# Patient Record
Sex: Female | Born: 1990 | Race: Black or African American | Hispanic: No | Marital: Single | Smoking: Never smoker
Health system: Southern US, Community
[De-identification: ages and names within clinical notes are randomized; demographics above are authoritative.]

## PROBLEM LIST (undated history)

## (undated) ENCOUNTER — Inpatient Hospital Stay (HOSPITAL_COMMUNITY): Payer: Self-pay

## (undated) DIAGNOSIS — B009 Herpesviral infection, unspecified: Secondary | ICD-10-CM

## (undated) HISTORY — PX: WISDOM TOOTH EXTRACTION: SHX21

---

## 2012-12-28 ENCOUNTER — Encounter (HOSPITAL_COMMUNITY): Payer: Self-pay | Admitting: Emergency Medicine

## 2012-12-28 ENCOUNTER — Emergency Department (HOSPITAL_COMMUNITY)
Admission: EM | Admit: 2012-12-28 | Discharge: 2012-12-28 | Disposition: A | Discharge status: Home / Self Care | Payer: Self-pay | Attending: Emergency Medicine | Admitting: Emergency Medicine

## 2012-12-28 DIAGNOSIS — B86 Scabies: Secondary | ICD-10-CM | POA: Insufficient documentation

## 2012-12-28 MED ORDER — PERMETHRIN 5 % EX CREA: TOPICAL_CREAM | CUTANEOUS | Status: DC

## 2012-12-28 NOTE — ED Notes (Signed)
Pt states she thinks she was exposed to scabbies a few days ago. Pt reports itching to both arms, both hands, and right thigh.

## 2012-12-28 NOTE — ED Provider Notes (Signed)
Medical screening examination/treatment/procedure(s) were performed by non-physician practitioner and as supervising physician I was immediately available for consultation/collaboration.   Dione Booze, MD 12/28/12 [phone]

## 2012-12-28 NOTE — ED Provider Notes (Signed)
History    This chart was scribed for non-physician practitioner Elpidio Anis, PA-C working with Dione Booze, MD by Gerlean Ren, ED Scribe. This patient was seen in room TR11C/TR11C and the patient's care was started at 10:01 PM.   CSN: [id number]  Arrival date & time 12/28/12  2127   First MD Initiated Contact with Patient 12/28/12 2150      Chief Complaint  Patient presents with  . Rash    The history is provided by the patient. No language interpreter was used.  Isabella Silva is a 22 y.o. 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     History reviewed. No pertinent past medical history.  History reviewed. No pertinent past surgical history.  No family history on file.  History  Substance Use Topics  . Smoking status: Never Smoker   . Smokeless tobacco: Not on file  . Alcohol Use: No    No OB history provided.   Review of Systems  Skin: Positive for rash.  All other systems reviewed and are negative.    Allergies  Review of patient's allergies indicates no known allergies.  Home Medications  No current outpatient prescriptions on file.  BP 154/90  Pulse 94  Temp(Src) 99.1 F (37.3 C) (Oral)  Resp 16  SpO2 100%  Physical Exam  Nursing note and vitals reviewed. Constitutional: She is oriented to person, place, and time. She appears well-developed and well-nourished. No distress.  HENT:  Head: Normocephalic and atraumatic.  Eyes: EOM are normal.  Neck: Neck supple. No tracheal deviation present.  Cardiovascular: Normal rate.   Pulmonary/Chest: Effort normal. No respiratory distress.  Musculoskeletal: Normal range of motion.  Neurological: She is alert and oriented to person, place, and time.  Skin: Skin is  warm and dry.  Psychiatric: She has a normal mood and affect. Her behavior is normal.    ED Course  Procedures (including critical care time) DIAGNOSTIC STUDIES: Oxygen Saturation is 100% on room air, normal by my interpretation.    COORDINATION OF CARE: 10:04 PM- Discussed treatment of scabies with lotion to be applied tonight.  Pt verbalizes understanding and agrees.    No diagnosis found.  1. Scabies   MDM  Exposure to scabies in Daycare setting with rash in common distribution of scabies infestation.      I personally performed the services described in this documentation, which was scribed in my presence. The recorded information has been reviewed and is accurate.     Arnoldo Hooker, PA-C 12/28/12 2217

## 2012-12-28 NOTE — ED Notes (Signed)
PT. REPORTS ITCHY RASHES AT ARMS /LEGS AND HANDS FOR SEVERAL DAYS .

## 2013-05-25 ENCOUNTER — Encounter (HOSPITAL_COMMUNITY): Payer: Self-pay

## 2013-05-25 ENCOUNTER — Emergency Department (HOSPITAL_COMMUNITY)
Admission: EM | Admit: 2013-05-25 | Discharge: 2013-05-25 | Disposition: A | Discharge status: Home / Self Care | Payer: Self-pay | Attending: Emergency Medicine | Admitting: Emergency Medicine

## 2013-05-25 DIAGNOSIS — L02419 Cutaneous abscess of limb, unspecified: Secondary | ICD-10-CM | POA: Insufficient documentation

## 2013-05-25 DIAGNOSIS — L0390 Cellulitis, unspecified: Secondary | ICD-10-CM

## 2013-05-25 MED ORDER — CEPHALEXIN 500 MG PO CAPS: 500.0000 mg | ORAL_CAPSULE | Freq: Four times a day (QID) | ORAL | Status: DC

## 2013-05-25 MED ORDER — SULFAMETHOXAZOLE-TRIMETHOPRIM 800-160 MG PO TABS: 1.0000 | ORAL_TABLET | Freq: Two times a day (BID) | ORAL | Status: DC

## 2013-05-25 NOTE — ED Provider Notes (Signed)
CSN: [id number]     Arrival date & time 05/25/13  1711 History  This chart was scribed for non-physician practitioner, Junious Silk, PA-C working with Shelda Jakes, MD by Greggory Stallion, ED scribe. This patient was seen in room TR10C/TR10C and the patient's care was started at 5:32 PM.   Chief Complaint  Patient presents with  . Abscess   The history is provided by the patient. No language interpreter was used.    HPI Comments: Courtnee Myer is a 22 y.o. female who presents to the Emergency Department complaining of a gradual onset abscess to her left lower leg with mild soreness that started 2 days ago. She states there has been some drainage and she tried to get it out but was unsuccessful. Palpation makes the pain worse. The area has gotten larger over the last few days. Pt denies fever.   No past medical history on file. No past surgical history on file. No family history on file. History  Substance Use Topics  . Smoking status: Never Smoker   . Smokeless tobacco: Not on file  . Alcohol Use: No   OB History   Grav Para Term Preterm Abortions TAB SAB Ect Mult Living                 Review of Systems  Constitutional: Negative for fever.  Skin:       Abscess  All other systems reviewed and are negative.    Allergies  Review of patient's allergies indicates no known allergies.  Home Medications   Current Outpatient Rx  Name  Route  Sig  Dispense  Refill  . medroxyPROGESTERone (DEPO-PROVERA) 150 MG/ML injection   Intramuscular   Inject 150 mg into the muscle every 3 (three) months.         . permethrin (ELIMITE) 5 % cream      Apply from neck down at night once then wash off in the morning. May repeat in one week with persistent symptoms.   60 g   0    BP 131/90  Pulse 45  Resp 18  SpO2 100%  Physical Exam  Nursing note and vitals reviewed. Constitutional: She is oriented to person, place, and time. She appears well-developed and well-nourished.  No distress.  HENT:  Head: Normocephalic and atraumatic.  Right Ear: External ear normal.  Left Ear: External ear normal.  Nose: Nose normal.  Mouth/Throat: Oropharynx is clear and moist.  Eyes: Conjunctivae are normal.  Neck: Normal range of motion.  Cardiovascular: Normal rate, regular rhythm and normal heart sounds.   Pulmonary/Chest: Effort normal and breath sounds normal. No stridor. No respiratory distress. She has no wheezes. She has no rales.  Abdominal: Soft. She exhibits no distension.  Musculoskeletal: Normal range of motion.  Neurological: She is alert and oriented to person, place, and time. She has normal strength.  Skin: Skin is warm and dry. She is not diaphoretic. No erythema.  6 cm area of erythema with no induration to left lower leg.   Psychiatric: She has a normal mood and affect. Her behavior is normal.    ED Course  Procedures (including critical care time)  DIAGNOSTIC STUDIES: Oxygen Saturation is 100% on RA, normal by my interpretation.    COORDINATION OF CARE: 5:36 PM-Discussed treatment plan which includes ultrasound to see if there is anything to I&D and antibiotic with pt at bedside and pt agreed to plan.   No fluid collection seen on ultrasound.   Labs Review Labs  Reviewed - No data to display Imaging Review No results found.  MDM   1. Cellulitis    Suspect uncomplicated cellulitis based on limited area of involvement, minimal pain, no systemic signs of illness (eg, fever, chills, dehydration, altered mental status, tachypnea, tachycardia, hypotension), no risk factors for serious illness (eg, extremes of age, general debility, immunocompromised status).   PE reveals redness, swelling, mildly tender, warm to touch. Skin intact, No bleeding. No bullae. Non purulent. Non circumferential.  Borders are not elevated or sharply demarcated.  Preformed ultrasound and did not detect any occult abscess. Drew a line around the area of infection. Pt was  instructed to return to the ED if area surpasses the boarder or pain intensifies.   Prescribed Bactrim to cover for MRSA and Keflex for other skin flora, direct pt to apply warm compresses and to return to ED for I&D if pain should increase or abscess should develop.          I personally performed the services described in this documentation, which was scribed in my presence. The recorded information has been reviewed and is accurate.    Mora Bellman, PA-C 05/25/13 1755

## 2013-05-25 NOTE — ED Notes (Signed)
Pt reports an abscess to her Left lower leg x6 days, pt reports pain, redness, swelling, and purulent drainage from area.

## 2013-05-25 NOTE — ED Notes (Signed)
Pt. Stated, It came up last Thursday and it was a little bump and its just gotten bigger and painful. No itching.  Pt. Unaware of any type of bite.

## 2013-05-26 NOTE — ED Provider Notes (Signed)
Medical screening examination/treatment/procedure(s) were performed by non-physician practitioner and as supervising physician I was immediately available for consultation/collaboration.   Shelda Jakes, MD 05/26/13 [phone]

## 2013-09-20 ENCOUNTER — Emergency Department (HOSPITAL_COMMUNITY)
Admission: EM | Admit: 2013-09-20 | Discharge: 2013-09-20 | Disposition: A | Discharge status: Home / Self Care | Payer: Medicaid Other | Attending: Emergency Medicine | Admitting: Emergency Medicine

## 2013-09-20 ENCOUNTER — Encounter (HOSPITAL_COMMUNITY): Payer: Self-pay | Admitting: Emergency Medicine

## 2013-09-20 DIAGNOSIS — M549 Dorsalgia, unspecified: Secondary | ICD-10-CM | POA: Insufficient documentation

## 2013-09-20 DIAGNOSIS — G44209 Tension-type headache, unspecified, not intractable: Secondary | ICD-10-CM

## 2013-09-20 DIAGNOSIS — Z3202 Encounter for pregnancy test, result negative: Secondary | ICD-10-CM | POA: Insufficient documentation

## 2013-09-20 DIAGNOSIS — Z791 Long term (current) use of non-steroidal anti-inflammatories (NSAID): Secondary | ICD-10-CM | POA: Insufficient documentation

## 2013-09-20 DIAGNOSIS — M542 Cervicalgia: Secondary | ICD-10-CM | POA: Insufficient documentation

## 2013-09-20 DIAGNOSIS — R51 Headache: Secondary | ICD-10-CM | POA: Insufficient documentation

## 2013-09-20 LAB — URINALYSIS, ROUTINE W REFLEX MICROSCOPIC
BILIRUBIN URINE: NEGATIVE
GLUCOSE, UA: NEGATIVE mg/dL
HGB URINE DIPSTICK: NEGATIVE
KETONES UR: NEGATIVE mg/dL
Leukocytes, UA: NEGATIVE
NITRITE: NEGATIVE
PH: 6 (ref 5.0–8.0)
Protein, ur: NEGATIVE mg/dL
SPECIFIC GRAVITY, URINE: 1.019 (ref 1.005–1.030)
Urobilinogen, UA: 0.2 mg/dL (ref 0.0–1.0)

## 2013-09-20 LAB — POCT PREGNANCY, URINE: Preg Test, Ur: NEGATIVE

## 2013-09-20 MED ORDER — METOCLOPRAMIDE HCL 5 MG PO TABS
5.0000 mg | ORAL_TABLET | Freq: Once | ORAL | Status: AC
  Administered 2013-09-20: 5 mg via ORAL
  Filled 2013-09-20: qty 1

## 2013-09-20 MED ORDER — DIPHENHYDRAMINE HCL 25 MG PO CAPS
25.0000 mg | ORAL_CAPSULE | Freq: Once | ORAL | Status: AC
  Administered 2013-09-20: 25 mg via ORAL
  Filled 2013-09-20: qty 1

## 2013-09-20 MED ORDER — KETOROLAC TROMETHAMINE 30 MG/ML IJ SOLN
30.0000 mg | Freq: Once | INTRAMUSCULAR | Status: AC
  Administered 2013-09-20: 30 mg via INTRAMUSCULAR

## 2013-09-20 MED ORDER — KETOROLAC TROMETHAMINE 30 MG/ML IJ SOLN
30.0000 mg | Freq: Once | INTRAMUSCULAR | Status: DC
  Filled 2013-09-20: qty 1

## 2013-09-20 MED ORDER — CYCLOBENZAPRINE HCL 10 MG PO TABS: 10.0000 mg | ORAL_TABLET | Freq: Two times a day (BID) | ORAL | Status: DC | PRN

## 2013-09-20 MED ORDER — NAPROXEN 500 MG PO TABS: 500.0000 mg | ORAL_TABLET | Freq: Two times a day (BID) | ORAL | Status: DC

## 2013-09-20 NOTE — ED Provider Notes (Signed)
CSN: [id number]     Arrival date & time 09/20/13  0234 History   First MD Initiated Contact with Patient 09/20/13 0246     Chief Complaint  Patient presents with  . Headache  . Back Pain   (Consider location/radiation/quality/duration/timing/severity/associated sxs/prior Treatment) Patient is a 23 y.o. female presenting with headaches and back pain.  Headache Pain location:  Generalized Radiates to:  Upper back Severity currently:  3/10 Onset quality:  Gradual Duration:  1 day Timing:  Constant Progression:  Unchanged Chronicity:  New Relieved by:  Nothing Worsened by:  Activity Ineffective treatments:  NSAIDs Associated symptoms: back pain and neck pain   Associated symptoms: no cough, no dizziness, no fever, no focal weakness, no hearing loss, no nausea, no photophobia, no sinus pressure and no sore throat   Risk factors: no family hx of SAH and lifestyle not sedentary   Back Pain Associated symptoms: headaches   Associated symptoms: no dysuria, no fever and no weakness     History reviewed. No pertinent past medical history. History reviewed. No pertinent past surgical history. No family history on file. History  Substance Use Topics  . Smoking status: Never Smoker   . Smokeless tobacco: Not on file  . Alcohol Use: No   OB History   Grav Para Term Preterm Abortions TAB SAB Ect Mult Living                 Review of Systems  Unable to perform ROS Constitutional: Negative for fever.  HENT: Negative for hearing loss, sinus pressure and sore throat.   Eyes: Negative for photophobia.  Respiratory: Negative for cough.   Gastrointestinal: Negative for nausea.  Genitourinary: Negative for dysuria, vaginal bleeding and vaginal discharge.  Musculoskeletal: Positive for back pain and neck pain.  Skin: Negative for rash.  Neurological: Positive for headaches. Negative for dizziness, focal weakness and weakness.  All other systems reviewed and are  negative.    Allergies  Review of patient's allergies indicates no known allergies.  Home Medications   Current Outpatient Rx  Name  Route  Sig  Dispense  Refill  . ibuprofen (ADVIL,MOTRIN) 200 MG tablet   Oral   Take 400 mg by mouth every 6 (six) hours as needed for pain.         . cyclobenzaprine (FLEXERIL) 10 MG tablet   Oral   Take 1 tablet (10 mg total) by mouth 2 (two) times daily as needed for muscle spasms.   10 tablet   0   . naproxen (NAPROSYN) 500 MG tablet   Oral   Take 1 tablet (500 mg total) by mouth 2 (two) times daily.   20 tablet   0    BP 117/70  Pulse 87  Temp(Src) 98.8 F (37.1 C) (Oral)  Resp 16  Ht 5\' 6"  (1.676 m)  Wt 172 lb 5 oz (78.16 kg)  BMI 27.83 kg/m2  SpO2 100%  LMP 09/05/2013 Physical Exam  Nursing note and vitals reviewed. Constitutional: She appears well-developed and well-nourished.  HENT:  Head: Normocephalic.  Right Ear: External ear normal.  Left Ear: External ear normal.  Eyes: Pupils are equal, round, and reactive to light.  Neck: Normal range of motion. Muscular tenderness present. No spinous process tenderness present. No erythema present.  Cardiovascular: Normal rate and regular rhythm.   Pulmonary/Chest: Effort normal.  Abdominal: Soft.  Musculoskeletal: She exhibits edema. She exhibits no tenderness.       Back:  Lymphadenopathy:    She  has no cervical adenopathy.  Neurological: She is alert.  Skin: Skin is warm. No rash noted. No erythema.    ED Course  Procedures (including critical care time) Labs Review Labs Reviewed  URINALYSIS, ROUTINE W REFLEX MICROSCOPIC  POCT PREGNANCY, URINE   Imaging Review No results found.  EKG Interpretation   None       MDM   1. Headache, tension-type   2. Back pain         Arman Filter, NP 09/20/13 [phone]

## 2013-09-20 NOTE — Discharge Instructions (Signed)
If you were given medicines take as directed.  If you are on coumadin or contraceptives realize their levels and effectiveness is altered by many different medicines.  If you have any reaction (rash, tongues swelling, other) to the medicines stop taking and see a physician.   °Please follow up as directed and return to the ER or see a physician for new or worsening symptoms.  Thank you. ° ° °

## 2013-09-20 NOTE — ED Provider Notes (Signed)
Medical screening examination/treatment/procedure(s) were conducted as a shared visit with non-physician practitioner(s) or resident and myself. I personally evaluated the patient during the encounter and agree with the findings and plan unless otherwise indicated.  Labs Reviewed  URINALYSIS, ROUTINE W REFLEX MICROSCOPIC  POCT PREGNANCY, URINE    I have personally reviewed any xrays and/ or EKG's with the provider and I agree with interpretation.  Pt with HA, general radiating to neck. No injuries, fevers, IVDU. Gradual onset. No neck stiffness.  Exam 5+ strength in UE and LE with f/e at major joints.  Sensation to palpation intact in UE and LE.  CNs 2-12 grossly intact. EOMFI. PERRL.  Finger nose and coordination intact bilateral.  Visual fields intact to finger testing.  Supple neck, no meningismus.  Supportive care/ pain meds in ED. Pt improved on recheck. Likely tension HA. I do not suspect SAH, Thrombosis, Stroke or meningitis at this time. No new furnace or family members with HA.  Fup discussed.  HA tension like   Adlai Nieblas M ZEnid Skeensavitz, MD 09/20/13 [id number]

## 2013-09-20 NOTE — ED Notes (Signed)
PT reports pain to back from upper back down to lower back . Unknown cause. Pt also reports a HA.

## 2013-11-06 ENCOUNTER — Inpatient Hospital Stay (HOSPITAL_COMMUNITY): Payer: Medicaid Other

## 2013-11-06 ENCOUNTER — Encounter (HOSPITAL_COMMUNITY): Payer: Self-pay | Admitting: Family

## 2013-11-06 ENCOUNTER — Inpatient Hospital Stay (HOSPITAL_COMMUNITY)
Admission: AD | Admit: 2013-11-06 | Discharge: 2013-11-06 | Disposition: A | Discharge status: Home / Self Care | Payer: Medicaid Other | Source: Ambulatory Visit | Attending: Obstetrics & Gynecology | Admitting: Obstetrics & Gynecology

## 2013-11-06 DIAGNOSIS — O26849 Uterine size-date discrepancy, unspecified trimester: Secondary | ICD-10-CM

## 2013-11-06 DIAGNOSIS — R109 Unspecified abdominal pain: Secondary | ICD-10-CM | POA: Insufficient documentation

## 2013-11-06 DIAGNOSIS — M25519 Pain in unspecified shoulder: Secondary | ICD-10-CM | POA: Insufficient documentation

## 2013-11-06 DIAGNOSIS — O99891 Other specified diseases and conditions complicating pregnancy: Secondary | ICD-10-CM | POA: Insufficient documentation

## 2013-11-06 DIAGNOSIS — W108XXA Fall (on) (from) other stairs and steps, initial encounter: Secondary | ICD-10-CM | POA: Insufficient documentation

## 2013-11-06 DIAGNOSIS — O9989 Other specified diseases and conditions complicating pregnancy, childbirth and the puerperium: Principal | ICD-10-CM

## 2013-11-06 DIAGNOSIS — O26899 Other specified pregnancy related conditions, unspecified trimester: Secondary | ICD-10-CM

## 2013-11-06 LAB — URINALYSIS, ROUTINE W REFLEX MICROSCOPIC
Bilirubin Urine: NEGATIVE
GLUCOSE, UA: NEGATIVE mg/dL
HGB URINE DIPSTICK: NEGATIVE
Ketones, ur: NEGATIVE mg/dL
LEUKOCYTES UA: NEGATIVE
Nitrite: NEGATIVE
PROTEIN: NEGATIVE mg/dL
SPECIFIC GRAVITY, URINE: 1.01 (ref 1.005–1.030)
UROBILINOGEN UA: 0.2 mg/dL (ref 0.0–1.0)
pH: 6.5 (ref 5.0–8.0)

## 2013-11-06 LAB — HCG, QUANTITATIVE, PREGNANCY: HCG, BETA CHAIN, QUANT, S: 115410 m[IU]/mL — AB (ref <5)

## 2013-11-06 LAB — WET PREP, GENITAL
Clue Cells Wet Prep HPF POC: NONE SEEN
Trich, Wet Prep: NONE SEEN
Yeast Wet Prep HPF POC: NONE SEEN

## 2013-11-06 LAB — CBC
HCT: 36.9 % (ref 36.0–46.0)
Hemoglobin: 13 g/dL (ref 12.0–15.0)
MCH: 27.1 pg (ref 26.0–34.0)
MCHC: 35.2 g/dL (ref 30.0–36.0)
MCV: 77 fL — AB (ref 78.0–100.0)
PLATELETS: 251 10*3/uL (ref 150–400)
RBC: 4.79 MIL/uL (ref 3.87–5.11)
RDW: 14.8 % (ref 11.5–15.5)
WBC: 5.7 10*3/uL (ref 4.0–10.5)

## 2013-11-06 LAB — POCT PREGNANCY, URINE: PREG TEST UR: POSITIVE — AB

## 2013-11-06 LAB — ABO/RH: ABO/RH(D): O POS

## 2013-11-06 NOTE — MAU Provider Note (Signed)
History   Isabella Silva is a 23 yo G1 at [redacted]wks gestation based on LMP (08/03/14), presents to MAU s/p fall yesterday. Reports she lost balance and fell down about 4-5 indoor steps and landed on her R side. She denies any dizziness prior to fall and has no history of seizures.  The patient reports pain in the R shoulder but not decrease in ROM.  She has not taken in medication for the pain. Patient also reports  lower abdominal pain.  Denies VB, LOF. She had a negative pregnancy test in MAU on 1/20, but [facility]   CSN: [id number]  Arrival date and time: 11/06/13 1017   First Provider Initiated Contact with Patient 11/06/13 1046      Chief Complaint  Patient presents with  . Fall   HPI   Past Medical History  Diagnosis Date  . Medical history non-contributory     Past Surgical History  Procedure Laterality Date  . No past surgeries      History reviewed. No pertinent family history.  History  Substance Use Topics  . Smoking status: Never Smoker   . Smokeless tobacco: Not on file  . Alcohol Use: No    Allergies: No Known Allergies  Prescriptions prior to admission  Medication Sig Dispense Refill  . Prenatal Vit-Fe Fumarate-FA (PRENATAL MULTIVITAMIN) TABS tablet Take 1 tablet by mouth daily at 12 noon.        Review of Systems  Constitutional: Negative.   HENT: Negative.   Eyes: Negative.   Respiratory: Negative.   Cardiovascular: Negative.   Gastrointestinal: Positive for abdominal pain.  Genitourinary: Positive for frequency.  Musculoskeletal: Positive for falls and joint pain.  Skin: Negative.   Neurological: Negative.   Endo/Heme/Allergies: Negative.   Psychiatric/Behavioral: Negative.    Physical Exam   Blood pressure 132/76, pulse 93, temperature 98.3 F (36.8 C), temperature source Oral, resp. rate 13, height 5\' 5"  (1.651 m), weight 77.111 kg (170 lb), last menstrual period 08/03/2013.  Physical Exam  Constitutional:  She is oriented to person, place, and time. She appears well-developed and well-nourished.  HENT:  Head: Normocephalic and atraumatic.  Eyes: Conjunctivae are normal.  Neck: Normal range of motion. Neck supple.  Cardiovascular: Normal rate, regular rhythm and normal heart sounds.   Respiratory: Effort normal and breath sounds normal.  GI: Soft. There is tenderness.  Lower abdomen tender to palpation.  Genitourinary: Vagina normal. No vaginal discharge found.  Gravid uterus   Musculoskeletal: She exhibits tenderness.  Pain in right shoulder.   Neurological: She is alert and oriented to person, place, and time.  Skin: Skin is warm and dry.  Psychiatric: She has a normal mood and affect. Her behavior is normal. Thought content normal.    FHT: not heard by nurse or midwifery student  MAU Course  Procedures  MDM  Results for orders placed during the hospital encounter of 11/06/13 (from the past 24 hour(s))  URINALYSIS, ROUTINE W REFLEX MICROSCOPIC     Status: None   Collection Time    11/06/13 10:24 AM      Result Value Ref Range   Color, Urine YELLOW  YELLOW   APPearance CLEAR  CLEAR   Specific Gravity, Urine 1.010  1.005 - 1.030   pH 6.5  5.0 - 8.0   Glucose, UA NEGATIVE  NEGATIVE mg/dL   Hgb urine dipstick NEGATIVE  NEGATIVE   Bilirubin Urine NEGATIVE  NEGATIVE   Ketones, ur NEGATIVE  NEGATIVE mg/dL   Protein, ur NEGATIVE  NEGATIVE mg/dL   Urobilinogen, UA 0.2  0.0 - 1.0 mg/dL   Nitrite NEGATIVE  NEGATIVE   Leukocytes, UA NEGATIVE  NEGATIVE  POCT PREGNANCY, URINE     Status: Abnormal   Collection Time    11/06/13 11:01 AM      Result Value Ref Range   Preg Test, Ur POSITIVE (*) NEGATIVE  WET PREP, GENITAL     Status: Abnormal   Collection Time    11/06/13 11:20 AM      Result Value Ref Range   Yeast Wet Prep HPF POC NONE SEEN  NONE SEEN   Trich, Wet Prep NONE SEEN  NONE SEEN   Clue Cells Wet Prep HPF POC NONE SEEN  NONE SEEN   WBC, Wet Prep HPF POC FEW (*) NONE  SEEN  HCG, QUANTITATIVE, PREGNANCY     Status: Abnormal   Collection Time    11/06/13 11:40 AM      Result Value Ref Range   hCG, Beta Nyra Jabs, [place] 161096 (*) <5 mIU/mL  ABO/RH     Status: None   Collection Time    11/06/13 11:40 AM      Result Value Ref Range   ABO/RH(D) O POS    CBC     Status: Abnormal   Collection Time    11/06/13 11:40 AM      Result Value Ref Range   WBC 5.7  4.0 - 10.5 K/uL   RBC 4.79  3.87 - 5.11 MIL/uL   Hemoglobin 13.0  12.0 - 15.0 g/dL   HCT 04.5  40.9 - 81.1 %   MCV 77.0 (*) 78.0 - 100.0 fL   MCH 27.1  26.0 - 34.0 pg   MCHC 35.2  30.0 - 36.0 g/dL   RDW 91.4  78.2 - 95.6 %   Platelets 251  150 - 400 K/uL     Assessment and Plan  23 yo G1P0 with SIUP at 10.3 weeks Muscle aches Normal ultrasound  Discharge to home Advise to schedule initial PNV with [facility] or other provider Given confirmation of pregnancy letter  Selena Lesser 11/06/2013, 11:02 AM   I was present for the exam and agree with above.  US Ob Comp Less 14 Wks  11/06/2013   CLINICAL DATA:  Thirteen weeks bile as menstrual period. Lower abdominal pain.  EXAM: OBSTETRIC <14 WK ULTRASOUND  TECHNIQUE: Transabdominal ultrasound was performed for evaluation of the gestation as well as the maternal uterus and adnexal regions.  COMPARISON:  None.  FINDINGS: Intrauterine gestational sac: Visualized/normal in shape.  Yolk sac:  Visualized  Embryo:  Visualized  Cardiac Activity: Visualized  Heart Rate: 179 bpm  MSD:   mm    w     d  CRL:   34  mm   10 w 3 d                  [place] EDC: 06/01/2014  Maternal uterus/adnexae: No subchorionic hemorrhage. No adnexal masses. No free fluid.  IMPRESSION: 10 week 3 day intrauterine pregnancy by crown-rump length. Fetal heart rate 179 beats per min. No acute maternal findings.   Electronically Signed   By: Charlett Nose M.D.   On: 11/06/2013 12:23    Dorathy Kinsman, [place] 11/06/2013 3:50 PM

## 2013-11-06 NOTE — Discharge Instructions (Signed)
Abdominal Pain During Pregnancy °Abdominal pain is common in pregnancy. Most of the time, it does not cause harm. There are many causes of abdominal pain. Some causes are more serious than others. Some of the causes of abdominal pain in pregnancy are easily diagnosed. Occasionally, the diagnosis takes time to understand. Other times, the cause is not determined. Abdominal pain can be a sign that something is very wrong with the pregnancy, or the pain may have nothing to do with the pregnancy at all. For this reason, always tell your health care provider if you have any abdominal discomfort. °HOME CARE INSTRUCTIONS  °Monitor your abdominal pain for any changes. The following actions may help to alleviate any discomfort you are experiencing: °· Do not have sexual intercourse or put anything in your vagina until your symptoms go away completely. °· Get plenty of rest until your pain improves. °· Drink clear fluids if you feel nauseous. Avoid solid food as long as you are uncomfortable or nauseous. °· Only take over-the-counter or prescription medicine as directed by your health care provider. °· Keep all follow-up appointments with your health care provider. °SEEK IMMEDIATE MEDICAL CARE IF: °· You are bleeding, leaking fluid, or passing tissue from the vagina. °· You have increasing pain or cramping. °· You have persistent vomiting. °· You have painful or bloody urination. °· You have a fever. °· You notice a decrease in your baby's movements. °· You have extreme weakness or feel faint. °· You have shortness of breath, with or without abdominal pain. °· You develop a severe headache with abdominal pain. °· You have abnormal vaginal discharge with abdominal pain. °· You have persistent diarrhea. °· You have abdominal pain that continues even after rest, or gets worse. °MAKE SURE YOU:  °· Understand these instructions. °· Will watch your condition. °· Will get help right away if you are not doing well or get  worse. °Document Released: 08/18/2005 Document Revised: 06/08/2013 Document Reviewed: 03/17/2013 °[employer]® Patient Information ©2014 [employer]. ° °

## 2013-11-06 NOTE — MAU Note (Addendum)
22, LMP 08/03/14, presents to MAU s/p fall yesterday. Reports she lost balance and fell down about 4-5 indoor steps and landed on her R side, fell onto R shoulder. Denies cardiac or neural prodrome; has never had seizure.  She is having L abdominal discomfort and shoulder pain discomfort. Denies decreased shoulder ROM. Denies VB, LOF. Has not taken any meds.  Reports she is approximately 12 weeks, has been seen at [facility] for pregnancy confirmation; awaiting pg medicaid.

## 2013-11-07 LAB — GC/CHLAMYDIA PROBE AMP
CT PROBE, AMP APTIMA: NEGATIVE
GC Probe RNA: NEGATIVE

## 2013-11-08 LAB — OB RESULTS CONSOLE ABO/RH: RH Type: POSITIVE

## 2013-11-08 LAB — OB RESULTS CONSOLE GC/CHLAMYDIA
Chlamydia: NEGATIVE
GC PROBE AMP, GENITAL: NEGATIVE

## 2013-11-30 LAB — OB RESULTS CONSOLE RPR: RPR: NONREACTIVE

## 2013-11-30 LAB — OB RESULTS CONSOLE HIV ANTIBODY (ROUTINE TESTING): HIV: NONREACTIVE

## 2013-11-30 LAB — OB RESULTS CONSOLE HEPATITIS B SURFACE ANTIGEN: Hepatitis B Surface Ag: NEGATIVE

## 2013-11-30 LAB — OB RESULTS CONSOLE ANTIBODY SCREEN: ANTIBODY SCREEN: NEGATIVE

## 2013-11-30 LAB — OB RESULTS CONSOLE RUBELLA ANTIBODY, IGM: Rubella: IMMUNE

## 2014-03-18 ENCOUNTER — Encounter (HOSPITAL_COMMUNITY): Payer: Self-pay

## 2014-03-18 ENCOUNTER — Inpatient Hospital Stay (HOSPITAL_COMMUNITY)
Admission: AD | Admit: 2014-03-18 | Discharge: 2014-03-18 | Disposition: A | Discharge status: Home / Self Care | Payer: Medicaid Other | Source: Ambulatory Visit | Attending: Obstetrics & Gynecology | Admitting: Obstetrics & Gynecology

## 2014-03-18 DIAGNOSIS — M62838 Other muscle spasm: Secondary | ICD-10-CM | POA: Diagnosis not present

## 2014-03-18 DIAGNOSIS — R42 Dizziness and giddiness: Secondary | ICD-10-CM

## 2014-03-18 DIAGNOSIS — R109 Unspecified abdominal pain: Secondary | ICD-10-CM | POA: Insufficient documentation

## 2014-03-18 DIAGNOSIS — O99891 Other specified diseases and conditions complicating pregnancy: Secondary | ICD-10-CM | POA: Diagnosis not present

## 2014-03-18 DIAGNOSIS — M545 Low back pain, unspecified: Secondary | ICD-10-CM | POA: Diagnosis not present

## 2014-03-18 DIAGNOSIS — O9989 Other specified diseases and conditions complicating pregnancy, childbirth and the puerperium: Principal | ICD-10-CM

## 2014-03-18 DIAGNOSIS — G44209 Tension-type headache, unspecified, not intractable: Secondary | ICD-10-CM

## 2014-03-18 LAB — URINALYSIS W MICROSCOPIC (NOT AT ARMC)
Bilirubin Urine: NEGATIVE
GLUCOSE, UA: NEGATIVE mg/dL
HGB URINE DIPSTICK: NEGATIVE
Ketones, ur: NEGATIVE mg/dL
Nitrite: NEGATIVE
PH: 8 (ref 5.0–8.0)
Protein, ur: NEGATIVE mg/dL
Specific Gravity, Urine: 1.01 (ref 1.005–1.030)
Urobilinogen, UA: 0.2 mg/dL (ref 0.0–1.0)

## 2014-03-18 MED ORDER — CYCLOBENZAPRINE HCL 5 MG PO TABS: 5.0000 mg | ORAL_TABLET | Freq: Three times a day (TID) | ORAL | Status: DC | PRN

## 2014-03-18 NOTE — Discharge Instructions (Signed)
General Headache Without Cause  A general headache is pain or discomfort felt around the head or neck area. The cause may not be found.   HOME CARE   · Keep all doctor visits.  · Only take medicines as told by your doctor.  · Lie down in a dark, quiet room when you have a headache.  · Keep a journal to find out if certain things bring on headaches. For example, write down:  ¨ What you eat and drink.  ¨ How much sleep you get.  ¨ Any change to your diet or medicines.  · Relax by getting a massage or doing other relaxing activities.  · Put ice or heat packs on the head and neck area as told by your doctor.  · Lessen stress.  · Sit up straight. Do not tighten (tense) your muscles.  · Quit smoking if you smoke.  · Lessen how much alcohol you drink.  · Lessen how much caffeine you drink, or stop drinking caffeine.  · Eat and sleep on a regular schedule.  · Get 7 to 9 hours of sleep, or as told by your doctor.  · Keep lights dim if bright lights bother you or make your headaches worse.  GET HELP RIGHT AWAY IF:   · Your headache becomes really bad.  · You have a fever.  · You have a stiff neck.  · You have trouble seeing.  · Your muscles are weak, or you lose muscle control.  · You lose your balance or have trouble walking.  · You feel like you will pass out (faint), or you pass out.  · You have really bad symptoms that are different than your first symptoms.  · You have problems with the medicines given to you by your doctor.  · Your medicines do not work.  · Your headache feels different than the other headaches.  · You feel sick to your stomach (nauseous) or throw up (vomit).  MAKE SURE YOU:   · Understand these instructions.  · Will watch your condition.  · Will get help right away if you are not doing well or get worse.  Document Released: 05/27/2008 Document Revised: 11/10/2011 Document Reviewed: 08/08/2011  ExitCare® Patient Information ©2015 [employer]. This information is not intended to replace advice given to  you by your health care provider. Make sure you discuss any questions you have with your health care provider.

## 2014-03-18 NOTE — MAU Provider Note (Signed)
Attestation of Attending Supervision of Fellow: Evaluation and management procedures were performed by the Fellow under my supervision and collaboration.  I have reviewed the Fellow's note and chart, and I agree with the management and plan.    

## 2014-03-18 NOTE — Progress Notes (Signed)
Notified of pt arrival in MAU and current complaints. Will come see pt

## 2014-03-18 NOTE — MAU Provider Note (Signed)
°  History     CSN: [id number]  Arrival date and time: 03/18/14 1304   None     Chief Complaint  Patient presents with   Abdominal Pain   Back Pain   Headache   HPI   Headaches: intermittently throughout week.  Resolve with tylenol, occur up to 3x weekly, worse with stress. No associated photophobia, light sensitivity, nausea/vomiting.  Dizziness: onset today, while walking.  No associated chest pain, no lower extr edema, no palpitations.  No syncopal/pre-syncopal episodes.  Symptoms resolved with rest and drinking water.  Abdominal cramping and lower back pain: worse at night, improves with tylenol, worse with stress.  No contractions, no dysuria.  Started Wednesday.  +FM, no LOF, no VB, no contractions.  Patient is 23 y.o. at 7769w2d by     Past Medical History  Diagnosis Date   Medical history non-contributory     Past Surgical History  Procedure Laterality Date   No past surgeries      History reviewed. No pertinent family history.  History  Substance Use Topics   Smoking status: Never Smoker    Smokeless tobacco: Not on file   Alcohol Use: No    Allergies: No Known Allergies  Prescriptions prior to admission  Medication Sig Dispense Refill   acetaminophen (TYLENOL) 325 MG tablet Take 325 mg by mouth every 6 (six) hours as needed for mild pain or moderate pain.       OVER THE COUNTER MEDICATION Place 1 application rectally daily as needed (for hemmroids). Pt used a cream for hemmroids       Prenatal Vit-Fe Fumarate-FA (PRENATAL MULTIVITAMIN) TABS tablet Take 1 tablet by mouth daily at 12 noon.        Review of Systems  Constitutional: Negative for chills, weight loss and malaise/fatigue.  Respiratory: Negative for cough and shortness of breath.   Cardiovascular: Negative for chest pain, palpitations, orthopnea, leg swelling and PND.  Gastrointestinal: Negative for nausea and vomiting.  Neurological: Positive for dizziness. Negative for  seizures and loss of consciousness.  Endo/Heme/Allergies: Does not bruise/bleed easily.  Psychiatric/Behavioral: The patient is nervous/anxious.    Physical Exam   Blood pressure 125/65, pulse 98, temperature 98 F (36.7 C), temperature source Oral, resp. rate 18, height 5\' 5"  (1.651 m), weight 80.196 kg (176 lb 12.8 oz), last menstrual period 08/03/2013.  Physical Exam  Constitutional: She appears well-developed and well-nourished. No distress.  Cardiovascular: Normal rate, regular rhythm and normal heart sounds.   Respiratory: Effort normal and breath sounds normal. No respiratory distress. She has no wheezes.  GI: She exhibits no distension.  Gravid   Musculoskeletal: She exhibits no edema.  Neurological: She is alert. No cranial nerve deficit.  Skin: Skin is warm and dry. No erythema.  Psychiatric: She has a normal mood and affect.   NST: reactive Baseline 145, +accels, no decels, no contractions, moderate variation  MAU Course  Procedures  MDM UA: dirty catch Orthostatic blood pressures: negative  Assessment and Plan  Rx: flexeril 5mg  po prn TID muscle spasm, back pain Encouraged PO hydration Discussed preterm labor precautions  Fredirick LatheKristy Acosta 03/18/2014, 1:46 PM

## 2014-03-18 NOTE — MAU Note (Signed)
Pt presents complaining of headaches, lower abdominal pain and back pain that started on Wednesday and has been intermittent since then. Denies vaginal bleeding or discharge. Reports good fetal movement.

## 2014-03-18 NOTE — MAU Note (Signed)
Pt states headaches are relieved by tylenol when she takes it and states she is currently under high levels of stress from "personal reasons"

## 2014-05-18 ENCOUNTER — Other Ambulatory Visit: Payer: Self-pay | Admitting: Obstetrics & Gynecology

## 2014-05-22 ENCOUNTER — Encounter (HOSPITAL_COMMUNITY): Payer: Self-pay

## 2014-05-23 ENCOUNTER — Encounter (HOSPITAL_COMMUNITY): Payer: Self-pay | Admitting: Pharmacy Technician

## 2014-05-23 ENCOUNTER — Encounter (HOSPITAL_COMMUNITY)
Admission: RE | Admit: 2014-05-23 | Discharge: 2014-05-23 | Disposition: A | Discharge status: Home / Self Care | Payer: Medicaid Other | Source: Ambulatory Visit | Attending: Obstetrics & Gynecology | Admitting: Obstetrics & Gynecology

## 2014-05-23 ENCOUNTER — Encounter (HOSPITAL_COMMUNITY): Payer: Self-pay

## 2014-05-23 HISTORY — DX: Herpesviral infection, unspecified: B00.9

## 2014-05-23 LAB — TYPE AND SCREEN
ABO/RH(D): O POS
Antibody Screen: NEGATIVE

## 2014-05-23 LAB — RPR

## 2014-05-23 LAB — CBC
HCT: 38.4 % (ref 36.0–46.0)
HEMOGLOBIN: 13 g/dL (ref 12.0–15.0)
MCH: 27.1 pg (ref 26.0–34.0)
MCHC: 33.9 g/dL (ref 30.0–36.0)
MCV: 80 fL (ref 78.0–100.0)
Platelets: 236 10*3/uL (ref 150–400)
RBC: 4.8 MIL/uL (ref 3.87–5.11)
RDW: 15.5 % (ref 11.5–15.5)
WBC: 7.6 10*3/uL (ref 4.0–10.5)

## 2014-05-23 NOTE — Patient Instructions (Addendum)
   Your procedure is scheduled on: Thursday, Sept 24  Enter through the [employer] of [facility] at: 145 PM Pick up the phone at the desk and dial [phone] and inform us of your arrival.  Please call this number if you have any problems the morning of surgery: [phone]  Remember: Do not eat food after midnight: Wednesday Do not drink clear liquids after: 11 AM Thursday, day of surgery Take these medicines the morning of surgery with a SIP OF WATER:  None  Do not wear jewelry, make-up, or FINGER nail polish No metal in your hair or on your body. Do not wear lotions, powders, perfumes.  You may wear deodorant.  Do not bring valuables to the hospital. Contacts, dentures or bridgework may not be worn into surgery.  Leave suitcase in the car. After Surgery it may be brought to your room. For patients being admitted to the hospital, checkout time is 11:00am the day of discharge.  Home with FOB Eric cell [phone] or mother Lajoyce Corners cell [phone]

## 2014-05-24 NOTE — H&P (Signed)
Isabella Silva is a 23 y.o. female presenting for primary cesarean section for breech presentation. LMP 08/29/13 EDC 06/01/14 by 1st trimester ultrasound. EGA 39 W.  History OB History   Grav Para Term Preterm Abortions TAB SAB Ect Mult Living   1              Past Medical History  Diagnosis Date  . HSV infection    Past Surgical History  Procedure Laterality Date  . Wisdom tooth extraction     Family History: family history is not on file. Social History:  reports that she has never smoked. She has never used smokeless tobacco. She reports that she does not drink alcohol or use illicit drugs.   Prenatal Transfer Tool  Maternal Diabetes: No Genetic Screening: Normal Maternal Ultrasounds/Referrals: Normal Fetal Ultrasounds or other Referrals:  Other:  Maternal Substance Abuse:  No Significant Maternal Medications:  Meds include: Other:  Significant Maternal Lab Results:  Lab values include: Other:  Other Comments:  Fetal breech presentation on ultrasound.  Maternal medication includes Valtrex, Prenatal vitamin tabs.   Maternal labs significant for sickle cell trait.   ROS GEN: Denies fevers/chills. CVS: Denies chest pain or shortness of breath or palpitations.  PULM: Denies wheezing or coughing. GI: Denies nausea or vomiting or diarrhea GENITOURINARY: Denies dysuria, urgency or frequency.  Denies vaginal bleeding or contractions or leakage of fluid.   MUSCULOSKELETAL: With leg swelling and pain in pregnancy.    Last menstrual period 08/03/2013. Exam Physical Exam  GEN: NAD CVS: s1, s2, RRR PULM: CTAB ABD:S/Gravid EXT: 3+ edema in left leg, 2+ edema in right leg.  CVX: 1/long/-3 in office.   Prenatal labs: ABO, Rh: --/--/O POS (09/22 1055) Antibody: NEG (09/22 1055) Rubella: Immune (04/01 0000) RPR: NON REAC (09/22 1055)  HBsAg: Negative (04/01 0000)  HIV: Non-reactive (04/01 0000)  GBS: Pos.    Assessment/Plan: 23 y/o G1P0 @ 70 W EGA who is here for a primary  C/S due to breech presentation. Patient was offered external cephalic version but she declined.  We discussed risks of cesarean section including risks of bleeding, infection, damage to organs.  All questions were answered.     [facility] 05/24/2014, 7:15 PM

## 2014-05-24 NOTE — H&P (Signed)
Isabella Silva is a 23 y.o. female, G1 P0 at 39.0 weeks  There are no active problems to display for this patient.   Pregnancy Course: Patient entered care at 15.1 weeks.   EDC of 06/01/14 was established by [place].      [place] evaluations:  10.3 weeks - Dating:   IUP S c/w D, FHR 179 18.4 weeks - Anatomy:    AUA 18.2 weeks, EFW 8oz-41%tile, FHR 158, Vertex, posterior placenta, no previa,     normal fluid normal cord insertion, cervix closed, anatomy wnl except profile, NB, DA not     seen.   35.4 weeks -    FU: EFW 5lb 15oz - 24.9%tile, AFI 14.9, FHR 161, profile and NB seen, DA no well seen     d/t GA, Frank Breech, fundal placenta,   Significant prenatal events:   HSV 1 & 2 positive, Sickle cell trait Last evaluation:   38.0 weeks   VE:1/40/-3 on 05/09/14  Reason for admission:  Primary CS d/t breech fetal position  Pt States:   Contractions Frequency: n/a         Contraction severity: n/a         Fetal activity: +FM  OB History   Grav Para Term Preterm Abortions TAB SAB Ect Mult Living   1              Past Medical History  Diagnosis Date  . HSV infection    Past Surgical History  Procedure Laterality Date  . Wisdom tooth extraction     Family History: family history is not on file. Social History:  reports that she has never smoked. She has never used smokeless tobacco. She reports that she does not drink alcohol or use illicit drugs.   Prenatal Transfer Tool  Maternal Diabetes: No Genetic Screening: Normal Maternal Ultrasounds/Referrals: Normal Fetal Ultrasounds or other Referrals:  None Maternal Substance Abuse:  No Significant Maternal Medications:  None Significant Maternal Lab Results: Lab values include: Group B Strep positive, Other: AS, HSV 1 & 2 positive   ROS:  See HPI above, all other systems are negative  No Known Allergies    Last menstrual period 08/03/2013.  Maternal Exam:  Uterine Assessment: Contraction frequency is rare.  Abdomen: Gravid, non  tender. Fundal height is aga.  Normal external genitalia, vulva, cervix, uterus and adnexa.  No lesions noted on exam.  Fetal presentation: Breech by [place] at 35.4 weeks  Fetal Exam:  Monitor Surveillance : Continuous Monitoring Mode: Ultrasound.  EFW 7 lbs  Physical Exam: Nursing note and vitals reviewed General: alert and cooperative She appears well nourished.  Psychiatric: Normal mood and affect. Her behavior is normal.  Head: Normocephalic.  Eyes: Pupils are equal, round, and reactive to light.  Neck: Normal range of motion.  Cardiovascular: RRR without murmur.  Respiratory: CTAB. Effort normal.  Abd: soft, non-tender, +BS, no rebound, no guarding  Genitourinary: Vagina normal.  Neurological: A&Ox3.  Skin: Warm and dry. Musculoskeletal: Normal range of motion.  Homan's sign negative bilaterally.  No evidence of DVTs.  Edema 2+ edema BL     Prenatal labs: ABO, Rh: --/--/O POS (09/22 1055) Antibody: NEG (09/22 1055) Rubella:    RPR: NON REAC (09/22 1055)  HBsAg: Negative (04/01 0000)  HIV: Non-reactive (04/01 0000)  GBS:  positive 05/09/14 Sickle cell/Hgb electrophoresis:  AS Pap:  Normal 12/13/13 GC:   negative Chlamydia: negative Genetic screenings:   Glucola:  wnl in [place] per pr on 04/11/14  Assessment:  IUP at 39.0 weeks Membranes: intact GBS positive Diagnosis: Breech presentation  Plan:  Admit to BS for schedule primary CS d/t breech presentation R&B of CS reviewed with patient and family.  Pt and family verbalize understanding of the procedure and agree with treatment plan.  Possibility of needing a blood transfusion reviewed.   Pt will accept blood products.  Routine Pre-OP orders Ancef per protocol May have a clear/thin diet 6 hours after CS, advance as tolerate 12 hours after CS      Venus Standard, CNM, MSN 05/24/2014, 2:40 PM.   All information will be confirmed upon admission

## 2014-05-25 ENCOUNTER — Encounter (HOSPITAL_COMMUNITY)
Admission: RE | Disposition: A | Discharge status: Home / Self Care | Payer: Self-pay | Source: Ambulatory Visit | Attending: Obstetrics & Gynecology

## 2014-05-25 ENCOUNTER — Inpatient Hospital Stay (HOSPITAL_COMMUNITY)
Admission: RE | Admit: 2014-05-25 | Discharge: 2014-05-28 | DRG: 765 | Disposition: A | Discharge status: Home / Self Care | Payer: Medicaid Other | Source: Ambulatory Visit | Attending: Obstetrics & Gynecology | Admitting: Obstetrics & Gynecology

## 2014-05-25 ENCOUNTER — Inpatient Hospital Stay (HOSPITAL_COMMUNITY): Payer: Medicaid Other | Admitting: Anesthesiology

## 2014-05-25 ENCOUNTER — Encounter (HOSPITAL_COMMUNITY): Payer: Medicaid Other | Admitting: Anesthesiology

## 2014-05-25 ENCOUNTER — Encounter (HOSPITAL_COMMUNITY): Payer: Self-pay

## 2014-05-25 DIAGNOSIS — A6000 Herpesviral infection of urogenital system, unspecified: Secondary | ICD-10-CM | POA: Diagnosis present

## 2014-05-25 DIAGNOSIS — D649 Anemia, unspecified: Secondary | ICD-10-CM | POA: Diagnosis present

## 2014-05-25 DIAGNOSIS — Z803 Family history of malignant neoplasm of breast: Secondary | ICD-10-CM

## 2014-05-25 DIAGNOSIS — O9902 Anemia complicating childbirth: Secondary | ICD-10-CM | POA: Diagnosis present

## 2014-05-25 DIAGNOSIS — O98519 Other viral diseases complicating pregnancy, unspecified trimester: Secondary | ICD-10-CM | POA: Diagnosis present

## 2014-05-25 DIAGNOSIS — R229 Localized swelling, mass and lump, unspecified: Secondary | ICD-10-CM | POA: Diagnosis present

## 2014-05-25 DIAGNOSIS — Z98891 History of uterine scar from previous surgery: Secondary | ICD-10-CM

## 2014-05-25 DIAGNOSIS — D573 Sickle-cell trait: Secondary | ICD-10-CM | POA: Diagnosis present

## 2014-05-25 DIAGNOSIS — O9989 Other specified diseases and conditions complicating pregnancy, childbirth and the puerperium: Secondary | ICD-10-CM

## 2014-05-25 DIAGNOSIS — O321XX0 Maternal care for breech presentation, not applicable or unspecified: Secondary | ICD-10-CM | POA: Diagnosis present

## 2014-05-25 DIAGNOSIS — O99892 Other specified diseases and conditions complicating childbirth: Secondary | ICD-10-CM | POA: Diagnosis present

## 2014-05-25 DIAGNOSIS — Z22330 Carrier of Group B streptococcus: Secondary | ICD-10-CM | POA: Diagnosis not present

## 2014-05-25 SURGERY — Surgical Case
Anesthesia: Spinal | Site: Abdomen

## 2014-05-25 MED ORDER — PHENYLEPHRINE 8 MG IN D5W 100 ML (0.08MG/ML) PREMIX OPTIME
INJECTION | INTRAVENOUS | Status: DC | PRN
  Administered 2014-05-25: 60 ug/min via INTRAVENOUS

## 2014-05-25 MED ORDER — MENTHOL 3 MG MT LOZG: 1.0000 | LOZENGE | OROMUCOSAL | Status: DC | PRN

## 2014-05-25 MED ORDER — LACTATED RINGERS IV SOLN
INTRAVENOUS | Status: DC | PRN
  Administered 2014-05-25 (×2): via INTRAVENOUS

## 2014-05-25 MED ORDER — SODIUM CHLORIDE 0.9 % IJ SOLN: 3.0000 mL | INTRAMUSCULAR | Status: DC | PRN

## 2014-05-25 MED ORDER — KETOROLAC TROMETHAMINE 30 MG/ML IJ SOLN
INTRAMUSCULAR | Status: AC
  Filled 2014-05-25: qty 1

## 2014-05-25 MED ORDER — FENTANYL CITRATE 0.05 MG/ML IJ SOLN
INTRAMUSCULAR | Status: DC | PRN
  Administered 2014-05-25: 25 ug via INTRATHECAL

## 2014-05-25 MED ORDER — OXYCODONE-ACETAMINOPHEN 5-325 MG PO TABS
2.0000 | ORAL_TABLET | ORAL | Status: DC | PRN
  Administered 2014-05-27 – 2014-05-28 (×6): 2 via ORAL
  Filled 2014-05-25 (×6): qty 2

## 2014-05-25 MED ORDER — ONDANSETRON HCL 4 MG/2ML IJ SOLN: 4.0000 mg | INTRAMUSCULAR | Status: DC | PRN

## 2014-05-25 MED ORDER — ONDANSETRON HCL 4 MG PO TABS: 4.0000 mg | ORAL_TABLET | ORAL | Status: DC | PRN

## 2014-05-25 MED ORDER — DIPHENHYDRAMINE HCL 25 MG PO CAPS: 25.0000 mg | ORAL_CAPSULE | Freq: Four times a day (QID) | ORAL | Status: DC | PRN

## 2014-05-25 MED ORDER — SCOPOLAMINE 1 MG/3DAYS TD PT72: 1.0000 | MEDICATED_PATCH | Freq: Once | TRANSDERMAL | Status: DC

## 2014-05-25 MED ORDER — WITCH HAZEL-GLYCERIN EX PADS: 1.0000 "application " | MEDICATED_PAD | CUTANEOUS | Status: DC | PRN

## 2014-05-25 MED ORDER — SIMETHICONE 80 MG PO CHEW
80.0000 mg | CHEWABLE_TABLET | ORAL | Status: DC
  Administered 2014-05-26 – 2014-05-28 (×3): 80 mg via ORAL
  Filled 2014-05-25 (×3): qty 1

## 2014-05-25 MED ORDER — ONDANSETRON HCL 4 MG/2ML IJ SOLN
INTRAMUSCULAR | Status: DC | PRN
  Administered 2014-05-25: 4 mg via INTRAVENOUS

## 2014-05-25 MED ORDER — MEPERIDINE HCL 25 MG/ML IJ SOLN: 6.2500 mg | INTRAMUSCULAR | Status: DC | PRN

## 2014-05-25 MED ORDER — TETANUS-DIPHTH-ACELL PERTUSSIS 5-2.5-18.5 LF-MCG/0.5 IM SUSP
0.5000 mL | Freq: Once | INTRAMUSCULAR | Status: AC
  Administered 2014-05-26: 0.5 mL via INTRAMUSCULAR
  Filled 2014-05-25: qty 0.5

## 2014-05-25 MED ORDER — LANOLIN HYDROUS EX OINT: 1.0000 "application " | TOPICAL_OINTMENT | CUTANEOUS | Status: DC | PRN

## 2014-05-25 MED ORDER — SCOPOLAMINE 1 MG/3DAYS TD PT72
MEDICATED_PATCH | TRANSDERMAL | Status: AC
  Administered 2014-05-25: 1.5 mg via TRANSDERMAL
  Filled 2014-05-25: qty 1

## 2014-05-25 MED ORDER — ONDANSETRON HCL 4 MG/2ML IJ SOLN: 4.0000 mg | Freq: Three times a day (TID) | INTRAMUSCULAR | Status: DC | PRN

## 2014-05-25 MED ORDER — SIMETHICONE 80 MG PO CHEW
80.0000 mg | CHEWABLE_TABLET | Freq: Three times a day (TID) | ORAL | Status: DC
  Administered 2014-05-26 – 2014-05-28 (×6): 80 mg via ORAL
  Filled 2014-05-25 (×6): qty 1

## 2014-05-25 MED ORDER — SCOPOLAMINE 1 MG/3DAYS TD PT72
1.0000 | MEDICATED_PATCH | Freq: Once | TRANSDERMAL | Status: DC
Start: 2014-05-25 — End: 2014-05-25

## 2014-05-25 MED ORDER — CEFAZOLIN SODIUM-DEXTROSE 2-3 GM-% IV SOLR
INTRAVENOUS | Status: AC
Start: 2014-05-25 — End: 2014-05-26
  Filled 2014-05-25: qty 50

## 2014-05-25 MED ORDER — MORPHINE SULFATE (PF) 0.5 MG/ML IJ SOLN
INTRAMUSCULAR | Status: DC | PRN
  Administered 2014-05-25: .1 mg via INTRATHECAL

## 2014-05-25 MED ORDER — MORPHINE SULFATE 0.5 MG/ML IJ SOLN
INTRAMUSCULAR | Status: AC
  Filled 2014-05-25: qty 10

## 2014-05-25 MED ORDER — IBUPROFEN 600 MG PO TABS
600.0000 mg | ORAL_TABLET | Freq: Four times a day (QID) | ORAL | Status: DC
  Administered 2014-05-26 – 2014-05-28 (×11): 600 mg via ORAL
  Filled 2014-05-25 (×11): qty 1

## 2014-05-25 MED ORDER — DIPHENHYDRAMINE HCL 50 MG/ML IJ SOLN
12.5000 mg | INTRAMUSCULAR | Status: DC | PRN
  Administered 2014-05-26: 12.5 mg via INTRAVENOUS
  Filled 2014-05-25: qty 1

## 2014-05-25 MED ORDER — NALBUPHINE HCL 10 MG/ML IJ SOLN
5.0000 mg | INTRAMUSCULAR | Status: DC | PRN
  Filled 2014-05-25 (×2): qty 1

## 2014-05-25 MED ORDER — ONDANSETRON HCL 4 MG/2ML IJ SOLN
INTRAMUSCULAR | Status: AC
  Filled 2014-05-25: qty 2

## 2014-05-25 MED ORDER — OXYTOCIN 10 UNIT/ML IJ SOLN
INTRAMUSCULAR | Status: AC
  Filled 2014-05-25: qty 4

## 2014-05-25 MED ORDER — FENTANYL CITRATE 0.05 MG/ML IJ SOLN
INTRAMUSCULAR | Status: AC
  Filled 2014-05-25: qty 2

## 2014-05-25 MED ORDER — PHENYLEPHRINE HCL 10 MG/ML IJ SOLN
INTRAMUSCULAR | Status: AC
  Filled 2014-05-25: qty 1

## 2014-05-25 MED ORDER — NALBUPHINE HCL 10 MG/ML IJ SOLN
5.0000 mg | Freq: Once | INTRAMUSCULAR | Status: AC | PRN
  Administered 2014-05-25: 5 mg via SUBCUTANEOUS

## 2014-05-25 MED ORDER — NALBUPHINE HCL 10 MG/ML IJ SOLN
5.0000 mg | INTRAMUSCULAR | Status: DC | PRN
  Administered 2014-05-25: 5 mg via INTRAVENOUS

## 2014-05-25 MED ORDER — DIBUCAINE 1 % RE OINT: 1.0000 "application " | TOPICAL_OINTMENT | RECTAL | Status: DC | PRN

## 2014-05-25 MED ORDER — CEFAZOLIN SODIUM-DEXTROSE 2-3 GM-% IV SOLR: 2.0000 g | INTRAVENOUS | Status: DC

## 2014-05-25 MED ORDER — PRENATAL MULTIVITAMIN CH
1.0000 | ORAL_TABLET | Freq: Every day | ORAL | Status: DC
  Administered 2014-05-26 – 2014-05-28 (×3): 1 via ORAL
  Filled 2014-05-25 (×3): qty 1

## 2014-05-25 MED ORDER — HYDROMORPHONE HCL 1 MG/ML IJ SOLN
INTRAMUSCULAR | Status: AC
  Administered 2014-05-25: 0.5 mg via INTRAVENOUS
  Filled 2014-05-25: qty 1

## 2014-05-25 MED ORDER — OXYTOCIN 40 UNITS IN LACTATED RINGERS INFUSION - SIMPLE MED: 62.5000 mL/h | INTRAVENOUS | Status: AC

## 2014-05-25 MED ORDER — LACTATED RINGERS IV SOLN
INTRAVENOUS | Status: DC
  Administered 2014-05-26: via INTRAVENOUS

## 2014-05-25 MED ORDER — LACTATED RINGERS IV SOLN
Freq: Once | INTRAVENOUS | Status: AC
  Administered 2014-05-25: 14:00:00 via INTRAVENOUS

## 2014-05-25 MED ORDER — DIPHENHYDRAMINE HCL 25 MG PO CAPS
25.0000 mg | ORAL_CAPSULE | ORAL | Status: DC | PRN
  Administered 2014-05-26 (×2): 25 mg via ORAL
  Filled 2014-05-25 (×2): qty 1

## 2014-05-25 MED ORDER — HYDROMORPHONE HCL 1 MG/ML IJ SOLN
0.2500 mg | INTRAMUSCULAR | Status: DC | PRN
  Administered 2014-05-25 (×2): 0.5 mg via INTRAVENOUS

## 2014-05-25 MED ORDER — OXYTOCIN 10 UNIT/ML IJ SOLN
40.0000 [IU] | INTRAVENOUS | Status: DC | PRN
  Administered 2014-05-25: 40 [IU] via INTRAVENOUS

## 2014-05-25 MED ORDER — ZOLPIDEM TARTRATE 5 MG PO TABS: 5.0000 mg | ORAL_TABLET | Freq: Every evening | ORAL | Status: DC | PRN

## 2014-05-25 MED ORDER — NALBUPHINE HCL 10 MG/ML IJ SOLN
INTRAMUSCULAR | Status: AC
  Administered 2014-05-25: 10 mg
  Filled 2014-05-25: qty 1

## 2014-05-25 MED ORDER — NALOXONE HCL 1 MG/ML IJ SOLN: 1.0000 ug/kg/h | INTRAVENOUS | Status: DC | PRN

## 2014-05-25 MED ORDER — NALOXONE HCL 0.4 MG/ML IJ SOLN: 0.4000 mg | INTRAMUSCULAR | Status: DC | PRN

## 2014-05-25 MED ORDER — KETOROLAC TROMETHAMINE 30 MG/ML IJ SOLN
30.0000 mg | Freq: Once | INTRAMUSCULAR | Status: AC
  Administered 2014-05-25: 30 mg via INTRAVENOUS

## 2014-05-25 MED ORDER — SCOPOLAMINE 1 MG/3DAYS TD PT72
1.0000 | MEDICATED_PATCH | Freq: Once | TRANSDERMAL | Status: AC
  Administered 2014-05-25: 1.5 mg via TRANSDERMAL

## 2014-05-25 MED ORDER — NALBUPHINE HCL 10 MG/ML IJ SOLN: 5.0000 mg | Freq: Once | INTRAMUSCULAR | Status: AC | PRN

## 2014-05-25 MED ORDER — CEFAZOLIN SODIUM-DEXTROSE 2-3 GM-% IV SOLR
2.0000 g | INTRAVENOUS | Status: AC
  Administered 2014-05-25: 2 g via INTRAVENOUS
  Filled 2014-05-25: qty 50

## 2014-05-25 MED ORDER — SENNOSIDES-DOCUSATE SODIUM 8.6-50 MG PO TABS
2.0000 | ORAL_TABLET | ORAL | Status: DC
  Administered 2014-05-26 – 2014-05-28 (×3): 2 via ORAL
  Filled 2014-05-25 (×3): qty 2

## 2014-05-25 MED ORDER — KETOROLAC TROMETHAMINE 30 MG/ML IJ SOLN: 30.0000 mg | Freq: Four times a day (QID) | INTRAMUSCULAR | Status: AC | PRN

## 2014-05-25 MED ORDER — SIMETHICONE 80 MG PO CHEW: 80.0000 mg | CHEWABLE_TABLET | ORAL | Status: DC | PRN

## 2014-05-25 MED ORDER — OXYCODONE-ACETAMINOPHEN 5-325 MG PO TABS
1.0000 | ORAL_TABLET | ORAL | Status: DC | PRN
  Administered 2014-05-26 – 2014-05-28 (×5): 1 via ORAL
  Filled 2014-05-25 (×5): qty 1

## 2014-05-25 MED ORDER — LACTATED RINGERS IV SOLN
Freq: Once | INTRAVENOUS | Status: AC
  Administered 2014-05-25: 12:00:00 via INTRAVENOUS

## 2014-05-25 SURGICAL SUPPLY — 40 items
BARRIER ADHS 3X4 INTERCEED (GAUZE/BANDAGES/DRESSINGS) ×3 IMPLANT
BLADE SURG 10 STRL SS (BLADE) ×6 IMPLANT
CLAMP CORD UMBIL (MISCELLANEOUS) IMPLANT
CLOTH BEACON ORANGE TIMEOUT ST (SAFETY) ×3 IMPLANT
CONTAINER PREFILL 10% NBF 15ML (MISCELLANEOUS) IMPLANT
COVER LIGHT HANDLE  1/PK (MISCELLANEOUS) ×4
COVER LIGHT HANDLE 1/PK (MISCELLANEOUS) ×2 IMPLANT
DERMABOND ADHESIVE PROPEN (GAUZE/BANDAGES/DRESSINGS) ×2
DERMABOND ADVANCED (GAUZE/BANDAGES/DRESSINGS) ×2
DERMABOND ADVANCED .7 DNX12 (GAUZE/BANDAGES/DRESSINGS) ×1 IMPLANT
DERMABOND ADVANCED .7 DNX6 (GAUZE/BANDAGES/DRESSINGS) ×1 IMPLANT
DRAPE SHEET LG 3/4 BI-LAMINATE (DRAPES) IMPLANT
DRSG OPSITE POSTOP 4X10 (GAUZE/BANDAGES/DRESSINGS) ×3 IMPLANT
DURAPREP 26ML APPLICATOR (WOUND CARE) ×3 IMPLANT
ELECT REM PT RETURN 9FT ADLT (ELECTROSURGICAL) ×3
ELECTRODE REM PT RTRN 9FT ADLT (ELECTROSURGICAL) ×1 IMPLANT
EXTRACTOR VACUUM M CUP 4 TUBE (SUCTIONS) IMPLANT
EXTRACTOR VACUUM M CUP 4' TUBE (SUCTIONS)
GLOVE BIOGEL PI IND STRL 7.0 (GLOVE) ×1 IMPLANT
GLOVE BIOGEL PI INDICATOR 7.0 (GLOVE) ×2
GLOVE SURG SS PI 6.5 STRL IVOR (GLOVE) ×3 IMPLANT
GOWN STRL REUS W/TWL LRG LVL3 (GOWN DISPOSABLE) ×6 IMPLANT
KIT ABG SYR 3ML LUER SLIP (SYRINGE) IMPLANT
NEEDLE HYPO 25X5/8 SAFETYGLIDE (NEEDLE) IMPLANT
NS IRRIG 1000ML POUR BTL (IV SOLUTION) ×3 IMPLANT
PACK C SECTION WH (CUSTOM PROCEDURE TRAY) ×3 IMPLANT
PAD OB MATERNITY 4.3X12.25 (PERSONAL CARE ITEMS) ×3 IMPLANT
RTRCTR C-SECT PINK 25CM LRG (MISCELLANEOUS) ×6 IMPLANT
SUT CHROMIC 1 CTX 36 (SUTURE) ×3 IMPLANT
SUT CHROMIC 2 0 CT 1 (SUTURE) IMPLANT
SUT MON AB 4-0 PS1 27 (SUTURE) ×3 IMPLANT
SUT PLAIN 1 NONE 54 (SUTURE) IMPLANT
SUT PLAIN 2 0 XLH (SUTURE) IMPLANT
SUT VIC AB 0 CTX 36 (SUTURE) ×2
SUT VIC AB 0 CTX36XBRD ANBCTRL (SUTURE) ×1 IMPLANT
SUT VIC AB 1 CTX 36 (SUTURE) ×2
SUT VIC AB 1 CTX36XBRD ANBCTRL (SUTURE) ×1 IMPLANT
TOWEL OR 17X24 6PK STRL BLUE (TOWEL DISPOSABLE) ×3 IMPLANT
TRAY FOLEY CATH 14FR (SET/KITS/TRAYS/PACK) ×3 IMPLANT
WATER STERILE IRR 1000ML POUR (IV SOLUTION) ×3 IMPLANT

## 2014-05-25 NOTE — Lactation Note (Signed)
This note was copied from the chart of Boy Sayde Lish. Lactation Consultation Note  Patient Name: Boy Marguerette Sheller [id number] Date: 05/25/2014 Reason for consult: Other (Comment) (charting for exclusion)   Maternal Data Formula Feeding for Exclusion: Yes Reason for exclusion: Mother's choice to formula feed on admision  Feeding Feeding Type: Formula Nipple Type: Slow - flow  LATCH Score/Interventions                      Lactation Tools Discussed/Used     Consult Status Consult Status: Complete    Lynda Rainwater 05/25/2014, 4:43 PM

## 2014-05-25 NOTE — Anesthesia Procedure Notes (Signed)

## 2014-05-25 NOTE — Anesthesia Preprocedure Evaluation (Signed)

## 2014-05-25 NOTE — Transfer of Care (Signed)
Immediate Anesthesia Transfer of Care Note  Patient: Isabella Silva  Procedure(s) Performed: Procedure(s): PRIMARY CESAREAN SECTION (N/A)  Patient Location: PACU  Anesthesia Type:Spinal  Level of Consciousness: awake, alert  and oriented  Airway & Oxygen Therapy: Patient Spontanous Breathing  Post-op Assessment: Report given to PACU RN and Post -op Vital signs reviewed and stable  Post vital signs: Reviewed and stable  Complications: No apparent anesthesia complications

## 2014-05-25 NOTE — Interval H&P Note (Signed)
History and Physical Interval Note:  05/25/2014 1:23 PM  Isabella Silva  has presented today for surgery, with the diagnosis of Breech Presentation  The various methods of treatment have been discussed with the patient and family. After consideration of risks, benefits and other options for treatment, the patient has consented to  Procedure(s): PRIMARY CESAREAN SECTION (N/A) as a surgical intervention .  The patient's history has been reviewed, patient examined, no change in status, stable for surgery.  I have reviewed the patient's chart and labs.  Questions were answered to the patient's satisfaction.     [facility]

## 2014-05-25 NOTE — Brief Op Note (Signed)
05/25/2014  3:42 PM  PATIENT:  Isabella Silva  23 y.o. female  PRE-OPERATIVE DIAGNOSIS:  Breech Presentation  POST-OPERATIVE DIAGNOSIS:  breech presentation  PROCEDURE:  Procedure(s): PRIMARY CESAREAN SECTION (N/A)  SURGEON:  Surgeon(s) and Role:    * Mateja Dier Alger Simons, MD - Primary  PHYSICIAN ASSISTANT:   ASSISTANTS: Venus Standard, CNM.    ANESTHESIA:   spinal  EBL:  Total I/O In: 2200 [I.V.:2200] Out: 650 [Urine:150; Blood:500]  BLOOD ADMINISTERED:none  DRAINS: none   LOCAL MEDICATIONS USED:  NONE  SPECIMEN:  Source of Specimen:  Placenta.   DISPOSITION OF SPECIMEN:  PATHOLOGY  COUNTS:  YES  TOURNIQUET:  * No tourniquets in log *  DICTATION: .Note written in EPIC  PLAN OF CARE: Admit to inpatient   PATIENT DISPOSITION:  PACU - hemodynamically stable.   Delay start of Pharmacological VTE agent (>24hrs) due to surgical blood loss or risk of bleeding: not applicable

## 2014-05-25 NOTE — Op Note (Signed)
Cesarean Section Procedure Note   [name], [name]  05/25/2014  Indications: Breech presentation.   Pre-operative Diagnosis: Breech presentation at 51 W EGA.     Post-operative Diagnosis: Same as above.    Surgeon: Surgeon(s) and Role:    * Jdyn Parkerson Alger Simons, MD - Primary      Assistants: Venus Standard, CNM.   Anesthesia: spinal   Procedure Details:  The patient was seen in the Holding Room. The risks, benefits, complications, treatment options, and expected outcomes were discussed with the patient. The patient concurred with the proposed plan, giving informed consent. identified as Isabella Silva and the procedure verified as C-Section Delivery. A Time Out was held and the above information confirmed.   After induction of anesthesia, the patient was draped and prepped in the usual sterile manner. A transverse pfannenstiel incision was made and carried down through the subcutaneous tissue to the fascia. Fascial incision was made and extended transversely. The fascia was separated from the underlying rectus tissue superiorly and inferiorly. The peritoneum was identified and entered. Peritoneal incision was extended longitudinally. The utero-vesical peritoneal reflection was incised transversely and the bladder flap was bluntly freed from the lower uterine segment. A low transverse uterine incision was made. This was extended bluntly with the bandage scissors.  Membranes were ruptured.  Fetus was delivered from frank breech presentation, RSA.  Sacrum was delivered followed by legs.  Gentle pull on sacrum allowed delivery of trunk.  Arms were then flexed successively to deliver them, finally head flexed at maxillary area and delivered.  Fetus was delivered atraumatically.  Body cord noted.  She delivered a viable female with Apgar scores of 8 at one minute and 9 at five minutes. Cord ph was not sent. The umbilical cord was clamped and placenta was removed Intact and appeared normal.  The  placenta and cord was handed over for collection of cord blood for public cord blood banking. The uterine cavity was cleaned with a lap, uterus was exteriorized.  The uterine outline, tubes and ovaries appeared normal. The uterine incision was closed in 2 layers, with second layer imbricating over the first one, with running locked sutures of 1-0 Vicryl.   Hemostasis was observed.  The uterus was returned into abdominal cavity.   Lavage was carried out until clear. The muscle was re-approximated with 2-0 chromic in interrupted stitches.  The fascia was then reapproximated with running sutures of 0 Vicryl. The subcuticular closure was performed using 2-0 plain gut. The skin was closed with 4-0 monocryl.  Dermabond then Honey comb dressing was applied over the incision.     Instrument, sponge, and needle counts were correct prior the abdominal closure and were correct at the conclusion of the case.    Findings:  Normal uterus, tubes and fallopian tubes bilaterally.    Estimated Blood Loss: 500 cc.   Total IV Fluids: 1.7 L LR.    Urine Output: 200 cc.  Specimens: Placenta, cord Blood.   Complications: no complications  Disposition: PACU - hemodynamically stable.   Maternal Condition: stable   Baby condition / location:  Nursery  Attending Attestation: I was present and scrubbed for the entire procedure.   Signed: Surgeon(s): Travontae Freiberger Alger Simons, MD

## 2014-05-25 NOTE — Anesthesia Postprocedure Evaluation (Signed)
  Anesthesia Post-op Note  Patient: Isabella Silva  Procedure(s) Performed: Procedure(s): PRIMARY CESAREAN SECTION (N/A)  Patient is awake, responsive, moving her legs, and has signs of resolution of her numbness. Pain and nausea are reasonably well controlled. Vital signs are stable and clinically acceptable. Oxygen saturation is clinically acceptable. There are no apparent anesthetic complications at this time. Patient is ready for discharge.

## 2014-05-26 ENCOUNTER — Encounter (HOSPITAL_COMMUNITY): Payer: Self-pay | Admitting: Obstetrics & Gynecology

## 2014-05-26 DIAGNOSIS — Z98891 History of uterine scar from previous surgery: Secondary | ICD-10-CM

## 2014-05-26 DIAGNOSIS — O321XX0 Maternal care for breech presentation, not applicable or unspecified: Secondary | ICD-10-CM | POA: Diagnosis present

## 2014-05-26 LAB — CBC
HEMATOCRIT: 30.2 % — AB (ref 36.0–46.0)
Hemoglobin: 10.2 g/dL — ABNORMAL LOW (ref 12.0–15.0)
MCH: 27.1 pg (ref 26.0–34.0)
MCHC: 33.8 g/dL (ref 30.0–36.0)
MCV: 80.3 fL (ref 78.0–100.0)
PLATELETS: 204 10*3/uL (ref 150–400)
RBC: 3.76 MIL/uL — ABNORMAL LOW (ref 3.87–5.11)
RDW: 15.7 % — ABNORMAL HIGH (ref 11.5–15.5)
WBC: 8 10*3/uL (ref 4.0–10.5)

## 2014-05-26 MED ORDER — FERROUS SULFATE 325 (65 FE) MG PO TABS
325.0000 mg | ORAL_TABLET | Freq: Two times a day (BID) | ORAL | Status: DC
  Administered 2014-05-26 – 2014-05-28 (×3): 325 mg via ORAL
  Filled 2014-05-26 (×3): qty 1

## 2014-05-26 NOTE — Progress Notes (Addendum)
Subjective: Postpartum Day 1: Cesarean Delivery due to breech Patient up ad lib, reports no syncope or dizziness. Feeding:  Breast/formula Contraceptive plan:  unsure  Objective: Vital signs in last 24 hours: Temp:  [97.5 F (36.4 C)-98.7 F (37.1 C)] 98.7 F (37.1 C) (09/25 0600) Pulse Rate:  [69-92] 82 (09/25 0600) Resp:  [16-20] 18 (09/25 0600) BP: (61-135)/(22-84) 115/59 mmHg (09/25 0600) SpO2:  [96 %-100 %] 96 % (09/25 0600) Weight:  [88.905 kg (196 lb)] 88.905 kg (196 lb) (09/24 1700)  Physical Exam:  General: alert and cooperative Lochia: appropriate Uterine Fundus: firm Abdomen:  + bowel sounds, non distended Incision: no significant drainage  Honeycomb dressing CDI DVT Evaluation: No evidence of DVT seen on physical exam. Homan's sign: Negative   Recent Labs  05/23/14 1055 05/26/14 0618  HGB 13.0 10.2*  HCT 38.4 30.2*  WBC 7.6 8.0    Assessment: Status post Cesarean section day 1. Doing well postoperatively.  Honeycomb dressing in place, no significant drainage   Plan: Continue current care. Breastfeeding and Lactation consult Iron  BID  Adalid Beckmann, CNM, MSN 05/26/2014. 9:26 AM

## 2014-05-26 NOTE — Anesthesia Postprocedure Evaluation (Signed)
Anesthesia Post Note  Patient: Isabella Silva  Procedure(s) Performed: Procedure(s) (LRB): PRIMARY CESAREAN SECTION (N/A)  Anesthesia type: Spinal  Patient location: Mother/Baby  Post pain: Pain level controlled  Post assessment: Post-op Vital signs reviewed  Last Vitals:  Filed Vitals:   05/26/14 0600  BP: 115/59  Pulse: 82  Temp: 37.1 C  Resp: 18    Post vital signs: Reviewed  Level of consciousness: awake  Complications: No apparent anesthesia complications

## 2014-05-26 NOTE — Lactation Note (Signed)
This note was copied from the chart of Isabella Hermie Reagor. Lactation Consultation Note: Per RN mom wants to try breast feeding. Did latch the baby once today. Reports that baby latched well but he wasn't getting anything. Has been giving bottles for formula today. Offered assist with latch but mom refused-states she will continue to bottle feed formula. To call for assist if changes her mind.   Patient Name: Isabella Silva [id number] Date: 05/26/2014 Reason for consult: Follow-up assessment   Maternal Data    Feeding Feeding Type: Bottle Fed - Formula  LATCH Score/Interventions                      Lactation Tools Discussed/Used     Consult Status Consult Status: Complete    Pamelia Hoit 05/26/2014, 3:47 PM

## 2014-05-26 NOTE — Addendum Note (Signed)
Addendum created 05/26/14 8413 by Algis Greenhouse, CRNA   Modules edited: Notes Section   Notes Section:  File: [id number]

## 2014-05-26 NOTE — Progress Notes (Addendum)
Subjective: Patient examined at bedside at 9.45 am.   Postpartum Day 1 Cesarean Delivery Patient reports tolerating PO and no problems voiding. Pain well controlled. Scant lochia.  Reports R armpit mass that has been present even before pregnancy.     Objective: Vital signs in last 24 hours: Temp:  [97.8 F (36.6 C)-98.8 F (37.1 C)] 98.2 F (36.8 C) (09/25 1356) Pulse Rate:  [69-83] 81 (09/25 1356) Resp:  [16-20] 18 (09/25 1356) BP: (100-135)/(54-70) 121/65 mmHg (09/25 1356) SpO2:  [96 %-99 %] 98 % (09/25 1007) Weight:  [196 lb (88.905 kg)] 196 lb (88.905 kg) (09/24 1700)  Physical Exam:  General: alert, cooperative and no distress Lochia: appropriate Uterine Fundus: firm Incision: clean, dry, intact.  DVT Evaluation: No evidence of DVT seen on physical exam. 1+ edema bilaterally on lower extremities.   Right armpit: 2 x 2 cm semi-firm bulging mass in mid aspect of armpit.  No entry point.  Non-tender to touch.  No drainage.  Left armpit appears normal.     Recent Labs  05/26/14 0618  HGB 10.2*  HCT 30.2*    Assessment/Plan: -Status post Cesarean section. Doing well postoperatively.  -Continue current care. -Lesion in right armpit may be extramammary tissue but do right armpit ultrasound to better evaluate lesion in armpit.  Pt with strong family history of breast cancer- 2 aunts diagnosed with breast cancer, one in her 30s.   -For neonate outpatient circumcision.     [facility] 05/26/2014, 4:06 PM

## 2014-05-27 MED ORDER — INFLUENZA VAC SPLIT QUAD 0.5 ML IM SUSY
0.5000 mL | PREFILLED_SYRINGE | INTRAMUSCULAR | Status: AC
  Administered 2014-05-27: 0.5 mL via INTRAMUSCULAR

## 2014-05-27 MED ORDER — INFLUENZA VAC SPLIT QUAD 0.5 ML IM SUSY: 0.5000 mL | PREFILLED_SYRINGE | INTRAMUSCULAR | Status: DC

## 2014-05-27 MED ORDER — MEDROXYPROGESTERONE ACETATE 150 MG/ML IM SUSP
150.0000 mg | Freq: Once | INTRAMUSCULAR | Status: AC
  Administered 2014-05-28: 150 mg via INTRAMUSCULAR
  Filled 2014-05-27: qty 1

## 2014-05-27 NOTE — Discharge Summary (Signed)
Cesarean Section Delivery Discharge Summary  Isabella Silva  DOB:    1990-11-11 MRN:    [id number] CSN:    [id number]  Date of admission:                  05/25/14  Date of discharge:                   05/28/14  Procedures this admission:  Primary CS for Breech  Date of Delivery: 05/25/14  Newborn Data:  Live born  Information for the patient's newborn:  Isabella Silva [[id number]]  female   Live born female  Birth Weight: 6 lb 2.8 oz (2800 g) APGAR: 9, 9  Home with mother. Name: unsure Circumcision Plan: out patient  History of Present Illness:  [name] is a 23 y.o. female, G1P1001, who presents at [redacted]w[redacted]d weeks gestation. The patient has been followed at the [facility] Gy[facility]cology division of [facility].    Her pregnancy has been complicated by:  Patient Active Problem List   Diagnosis Date Noted  . Status post primary low transverse cesarean section--breech 05/26/2014    Hospital course: The patient was admitted for primary CS.   Her postpartum course was not complicated. She was discharged to home on postpartum day 3 doing well.  Feeding: breast and bottle  Contraception: Depo-Provera Pt understands the risks are but not limited to irregular bleeding, formation of DVT, fluid fluctuations, elevation in blood pressure, stroke, breast tenderness and liver damage.  She states she will report any serious side effects.  She has been given verbal and written instructions and voiced a clear understanding.    Discharge hemoglobin: Hemoglobin  Date Value Ref Range Status  05/26/2014 10.2* 12.0 - 15.0 g/dL Final     HCT  Date Value Ref Range Status  05/26/2014 30.2* 36.0 - 46.0 % Final   Anemia - hemodynamicly stable.    PreNatal Labs ABO, Rh: --/--/O POS (09/22 1055)   Antibody: NEG (09/22 1055) Rubella:   imune RPR: NON REAC (09/22 1055)  HBsAg: Negative (04/01 0000)  HIV: Non-reactive (04/01 0000)  GBS:     Discharge Physical Exam:  General: alert and cooperative Lochia: appropriate Uterine Fundus: firm Incision: healing well, no significant drainage DVT Evaluation: No evidence of DVT seen on physical exam.  Intrapartum Procedures: cesarean: low cervical, transverse Postpartum Procedures: none Complications-Operative and Postpartum: none  Discharge Diagnoses: PTD  Discharge Information:  Activity:           pelvic rest Diet:                routine Medications: PNV, Ibuprofen, Iron and Percocet Condition:      stable   Newborn Data: Live born  Information for the patient's newborn:  Isabella Silva [[id number]]  female ; APGAR , ; weight ;  Home with mother. Referral to breast imaging.  [facility] office will call with info Discharge to: home  Follow-up Information   Follow up with [facility] : [facility]. Call in 2 weeks. (For wound re-check)    Specialty:  Obstetrics and Gynecology   Contact information:   [address] [place] [address] [phone]      Follow up with [facility]. Schedule an appointment as soon as possible for a visit in 6 weeks. (Call with any questions or concerns)    Specialty:  Obstetrics and Gynecology   Contact information:  [address] [place] [address] [phone]       Adelina Mings, CNM, MSN  05/27/2014. 4:11 PM  Care After Cesarean Delivery  Refer to this sheet in the next few weeks. These instructions provide you with information on caring for yourself after your procedure. Your caregiver may also give you specific instructions. Your treatment has been planned according to current medical practices, but problems sometimes occur. Call your caregiver if you have any problems or questions after you go home. HOME CARE INSTRUCTIONS  Only take over-the-counter or prescription medicines as directed by your caregiver.  Do not drink alcohol,  especially if you are breastfeeding or taking medicine to relieve pain.  Do not chew or smoke tobacco.  Continue to use good perineal care. Good perineal care includes:  Wiping your perineum from front to back.  Keeping your perineum clean.  Check your cut (incision) daily for increased redness, drainage, swelling, or separation of skin.  Clean your incision gently with soap and water every day, and then pat it dry. If your caregiver says it is okay, leave the incision uncovered. Use a bandage (dressing) if the incision is draining fluid or appears irritated. If the adhesive strips across the incision do not fall off within 7 days, carefully peel them off.  Hug a pillow when coughing or sneezing until your incision is healed. This helps to relieve pain.  Do not use tampons or douche until your caregiver says it is okay.  Shower, wash your hair, and take tub baths as directed by your caregiver.  Wear a well-fitting bra that provides breast support.  Limit wearing support panties or control-top hose.  Drink enough fluids to keep your urine clear or pale yellow.  Eat high-fiber foods such as whole grain cereals and breads, brown rice, beans, and fresh fruits and vegetables every day. These foods may help prevent or relieve constipation.  Resume activities such as climbing stairs, driving, lifting, exercising, or traveling as directed by your caregiver.  Talk to your caregiver about resuming sexual activities. This is dependent upon your risk of infection, your rate of healing, and your comfort and desire to resume sexual activity.  Try to have someone help you with your household activities and your newborn for at least a few days after you leave the hospital.  Rest as much as possible. Try to rest or take a nap when your newborn is sleeping.  Increase your activities gradually.  Keep all of your scheduled postpartum appointments. It is very important to keep your scheduled  follow-up appointments. At these appointments, your caregiver will be checking to make sure that you are healing physically and emotionally. SEEK MEDICAL CARE IF:   You are passing large clots from your vagina. Save any clots to show your caregiver.  You have a foul smelling discharge from your vagina.  You have trouble urinating.  You are urinating frequently.  You have pain when you urinate.  You have a change in your bowel movements.  You have increasing redness, pain, or swelling near your incision.  You have pus draining from your incision.  Your incision is separating.  You have painful, hard, or reddened breasts.  You have a severe headache.  You have blurred vision or see spots.  You feel sad or depressed.  You have thoughts of hurting yourself or your newborn.  You have questions about your care, the care of your newborn, or medicines.  You are dizzy or lightheaded.  You  have a rash.  You have pain, redness, or swelling at the site of the removed intravenous access (IV) tube.  You have nausea or vomiting.  You stopped breastfeeding and have not had a menstrual period within 12 weeks of stopping.  You are not breastfeeding and have not had a menstrual period within 12 weeks of delivery.  You have a fever. SEEK IMMEDIATE MEDICAL CARE IF:  You have persistent pain.  You have chest pain.  You have shortness of breath.  You faint.  You have leg pain.  You have stomach pain.  Your vaginal bleeding saturates 2 or more sanitary pads in 1 hour. MAKE SURE YOU:   Understand these instructions.  Will watch your condition.  Will get help right away if you are not doing well or get worse. Document Released: 05/10/2002 Document Revised: 05/12/2012 Document Reviewed: 04/14/2012 [facility] Patient Information 2014 [place], [place].   Postpartum Depression and Baby Blues  The postpartum period begins right after the birth of a baby. During this time,  there is often a great amount of joy and excitement. It is also a time of considerable changes in the life of the parent(s). Regardless of how many times a mother gives birth, each child brings new challenges and dynamics to the family. It is not unusual to have feelings of excitement accompanied by confusing shifts in moods, emotions, and thoughts. All mothers are at risk of developing postpartum depression or the "baby blues." These mood changes can occur right after giving birth, or they may occur many months after giving birth. The baby blues or postpartum depression can be mild or severe. Additionally, postpartum depression can resolve rather quickly, or it can be a long-term condition. CAUSES Elevated hormones and their rapid decline are thought to be a main cause of postpartum depression and the baby blues. There are a number of hormones that radically change during and after pregnancy. Estrogen and progesterone usually decrease immediately after delivering your baby. The level of thyroid hormone and various cortisol steroids also rapidly drop. Other factors that play a major role in these changes include major life events and genetics.  RISK FACTORS If you have any of the following risks for the baby blues or postpartum depression, know what symptoms to watch out for during the postpartum period. Risk factors that may increase the likelihood of getting the baby blues or postpartum depression include:  Havinga personal or family history of depression.  Having depression while being pregnant.  Having premenstrual or oral contraceptive-associated mood issues.  Having exceptional life stress.  Having marital conflict.  Lacking a social support network.  Having a baby with special needs.  Having health problems such as diabetes. SYMPTOMS Baby blues symptoms include:  Brief fluctuations in mood, such as going from extreme happiness to sadness.  Decreased concentration.  Difficulty  sleeping.  Crying spells, tearfulness.  Irritability.  Anxiety. Postpartum depression symptoms typically begin within the first month after giving birth. These symptoms include:  Difficulty sleeping or excessive sleepiness.  Marked weight loss.  Agitation.  Feelings of worthlessness.  Lack of interest in activity or food. Postpartum psychosis is a very concerning condition and can be dangerous. Fortunately, it is rare. Displaying any of the following symptoms is cause for immediate medical attention. Postpartum psychosis symptoms include:  Hallucinations and delusions.  Bizarre or disorganized behavior.  Confusion or disorientation. DIAGNOSIS  A diagnosis is made by an evaluation of your symptoms. There are no medical or lab tests that lead to  a diagnosis, but there are various questionnaires that a caregiver may use to identify those with the baby blues, postpartum depression, or psychosis. Often times, a screening tool called the [place] Postnatal Depression Scale is used to diagnose depression in the postpartum period.  TREATMENT The baby blues usually goes away on its own in 1 to 2 weeks. Social support is often all that is needed. You should be encouraged to get adequate sleep and rest. Occasionally, you may be given medicines to help you sleep.  Postpartum depression requires treatment as it can last several months or longer if it is not treated. Treatment may include individual or group therapy, medicine, or both to address any social, physiological, and psychological factors that may play a role in the depression. Regular exercise, a healthy diet, rest, and social support may also be strongly recommended.  Postpartum psychosis is more serious and needs treatment right away. Hospitalization is often needed. HOME CARE INSTRUCTIONS  Get as much rest as you can. Nap when the baby sleeps.  Exercise regularly. Some women find yoga and walking to be beneficial.  Eat a balanced  and nourishing diet.  Do little things that you enjoy. Have a cup of tea, take a bubble bath, read your favorite magazine, or listen to your favorite music.  Avoid alcohol.  Ask for help with household chores, cooking, grocery shopping, or running errands as needed. Do not try to do everything.  Talk to people close to you about how you are feeling. Get support from your partner, family members, friends, or other new moms.  Try to stay positive in how you think. Think about the things you are grateful for.  Do not spend a lot of time alone.  Only take medicine as directed by your caregiver.  Keep all your postpartum appointments.  Let your caregiver know if you have any concerns. SEEK MEDICAL CARE IF: You are having a reaction or problems with your medicine. SEEK IMMEDIATE MEDICAL CARE IF:  You have suicidal feelings.  You feel you may harm the baby or someone else. Document Released: 05/22/2004 Document Revised: 11/10/2011 Document Reviewed: 06/24/2011 [facility] Patient Information 2014 [place], [place].

## 2014-05-27 NOTE — Progress Notes (Signed)
Subjective: Postpartum Day 2: Cesarean Delivery due to breech Patient up ad lib, reports no syncope or dizziness. +Flatus, Feeding:  breast Contraceptive plan:  depo  Objective: Vital signs in last 24 hours: Temp:  [98.2 F (36.8 C)-98.9 F (37.2 C)] 98.2 F (36.8 C) (09/26 0615) Pulse Rate:  [81-103] 89 (09/26 0615) Resp:  [18] 18 (09/26 0615) BP: (114-121)/(59-66) 114/59 mmHg (09/26 0615) SpO2:  [98 %] 98 % (09/25 1356)  Physical Exam:  General: alert and cooperative Lochia: appropriate Uterine Fundus: firm Abdomen:  + bowel sounds, non distended Incision: healing well, no significant drainage  Honeycomb dressing CDI DVT Evaluation: No evidence of DVT seen on physical exam. Homan's sign: Negative   Recent Labs  05/26/14 0618  HGB 10.2*  HCT 30.2*  WBC 8.0   Assessment: Status post Cesarean section day 2. Doing well postoperatively.  Honeycomb dressing in place, no significant drainage Anemia - hemodynamicly stable.   Plan: Continue current care. Plan for discharge tomorrow, Breastfeeding and Lactation consult Out patient circ Dr.Cole updated on patient status   Ormand Senn, CNM, MSN 05/27/2014. 1:22 PM

## 2014-05-27 NOTE — Discharge Instructions (Signed)
Breastfeeding Deciding to breastfeed is one of the best choices you can make for you and your baby. A change in hormones during pregnancy causes your breast tissue to grow and increases the number and size of your milk ducts. These hormones also allow proteins, sugars, and fats from your blood supply to make breast milk in your milk-producing glands. Hormones prevent breast milk from being released before your baby is born as well as prompt milk flow after birth. Once breastfeeding has begun, thoughts of your baby, as well as his or her sucking or crying, can stimulate the release of milk from your milk-producing glands.  BENEFITS OF BREASTFEEDING For Your Baby  Your first milk (colostrum) helps your baby's digestive system function better.   There are antibodies in your milk that help your baby fight off infections.   Your baby has a lower incidence of asthma, allergies, and sudden infant death syndrome.   The nutrients in breast milk are better for your baby than infant formulas and are designed uniquely for your baby's needs.   Breast milk improves your baby's brain development.   Your baby is less likely to develop other conditions, such as childhood obesity, asthma, or type 2 diabetes mellitus.  For You   Breastfeeding helps to create a very special bond between you and your baby.   Breastfeeding is convenient. Breast milk is always available at the correct temperature and costs nothing.   Breastfeeding helps to burn calories and helps you lose the weight gained during pregnancy.   Breastfeeding makes your uterus contract to its prepregnancy size faster and slows bleeding (lochia) after you give birth.   Breastfeeding helps to lower your risk of developing type 2 diabetes mellitus, osteoporosis, and breast or ovarian cancer later in life. SIGNS THAT YOUR BABY IS HUNGRY Early Signs of Hunger  Increased alertness or activity.  Stretching.  Movement of the head from  side to side.  Movement of the head and opening of the mouth when the corner of the mouth or cheek is stroked (rooting).  Increased sucking sounds, smacking lips, cooing, sighing, or squeaking.  Hand-to-mouth movements.  Increased sucking of fingers or hands. Late Signs of Hunger  Fussing.  Intermittent crying. Extreme Signs of Hunger Signs of extreme hunger will require calming and consoling before your baby will be able to breastfeed successfully. Do not wait for the following signs of extreme hunger to occur before you initiate breastfeeding:   Restlessness.  A loud, strong cry.   Screaming. BREASTFEEDING BASICS Breastfeeding Initiation  Find a comfortable place to sit or lie down, with your neck and back well supported.  Place a pillow or rolled up blanket under your baby to bring him or her to the level of your breast (if you are seated). Nursing pillows are specially designed to help support your arms and your baby while you breastfeed.  Make sure that your baby's abdomen is facing your abdomen.   Gently massage your breast. With your fingertips, massage from your chest wall toward your nipple in a circular motion. This encourages milk flow. You may need to continue this action during the feeding if your milk flows slowly.  Support your breast with 4 fingers underneath and your thumb above your nipple. Make sure your fingers are well away from your nipple and your baby's mouth.   Stroke your baby's lips gently with your finger or nipple.   When your baby's mouth is open wide enough, quickly bring your baby to your   breast, placing your entire nipple and as much of the colored area around your nipple (areola) as possible into your baby's mouth.   More areola should be visible above your baby's upper lip than below the lower lip.   Your baby's tongue should be between his or her lower gum and your breast.   Ensure that your baby's mouth is correctly positioned  around your nipple (latched). Your baby's lips should create a seal on your breast and be turned out (everted).  It is common for your baby to suck about 2-3 minutes in order to start the flow of breast milk. Latching Teaching your baby how to latch on to your breast properly is very important. An improper latch can cause nipple pain and decreased milk supply for you and poor weight gain in your baby. Also, if your baby is not latched onto your nipple properly, he or she may swallow some air during feeding. This can make your baby fussy. Burping your baby when you switch breasts during the feeding can help to get rid of the air. However, teaching your baby to latch on properly is still the best way to prevent fussiness from swallowing air while breastfeeding. Signs that your baby has successfully latched on to your nipple:    Silent tugging or silent sucking, without causing you pain.   Swallowing heard between every 3-4 sucks.    Muscle movement above and in front of his or her ears while sucking.  Signs that your baby has not successfully latched on to nipple:   Sucking sounds or smacking sounds from your baby while breastfeeding.  Nipple pain. If you think your baby has not latched on correctly, slip your finger into the corner of your baby's mouth to break the suction and place it between your baby's gums. Attempt breastfeeding initiation again. Signs of Successful Breastfeeding Signs from your baby:   A gradual decrease in the number of sucks or complete cessation of sucking.   Falling asleep.   Relaxation of his or her body.   Retention of a small amount of milk in his or her mouth.   Letting go of your breast by himself or herself. Signs from you:  Breasts that have increased in firmness, weight, and size 1-3 hours after feeding.   Breasts that are softer immediately after breastfeeding.  Increased milk volume, as well as a change in milk consistency and color by  the fifth day of breastfeeding.   Nipples that are not sore, cracked, or bleeding. Signs That Your Baby is Getting Enough Milk  Wetting at least 3 diapers in a 24-hour period. The urine should be clear and pale yellow by age 5 days.  At least 3 stools in a 24-hour period by age 5 days. The stool should be soft and yellow.  At least 3 stools in a 24-hour period by age 7 days. The stool should be seedy and yellow.  No loss of weight greater than 10% of birth weight during the first 3 days of age.  Average weight gain of 4-7 ounces (113-198 g) per week after age 4 days.  Consistent daily weight gain by age 5 days, without weight loss after the age of 2 weeks. After a feeding, your baby may spit up a small amount. This is common. BREASTFEEDING FREQUENCY AND DURATION Frequent feeding will help you make more milk and can prevent sore nipples and breast engorgement. Breastfeed when you feel the need to reduce the fullness of your breasts   or when your baby shows signs of hunger. This is called "breastfeeding on demand." Avoid introducing a pacifier to your baby while you are working to establish breastfeeding (the first 4-6 weeks after your baby is born). After this time you may choose to use a pacifier. Research has shown that pacifier use during the first year of a baby's life decreases the risk of sudden infant death syndrome (SIDS). Allow your baby to feed on each breast as long as he or she wants. Breastfeed until your baby is finished feeding. When your baby unlatches or falls asleep while feeding from the first breast, offer the second breast. Because newborns are often sleepy in the first few weeks of life, you may need to awaken your baby to get him or her to feed. Breastfeeding times will vary from baby to baby. However, the following rules can serve as a guide to help you ensure that your baby is properly fed:  Newborns (babies 4 weeks of age or younger) may breastfeed every 1-3  hours.  Newborns should not go longer than 3 hours during the day or 5 hours during the night without breastfeeding.  You should breastfeed your baby a minimum of 8 times in a 24-hour period until you begin to introduce solid foods to your baby at around 6 months of age. BREAST MILK PUMPING Pumping and storing breast milk allows you to ensure that your baby is exclusively fed your breast milk, even at times when you are unable to breastfeed. This is especially important if you are going back to work while you are still breastfeeding or when you are not able to be present during feedings. Your lactation consultant can give you guidelines on how long it is safe to store breast milk.  A breast pump is a machine that allows you to pump milk from your breast into a sterile bottle. The pumped breast milk can then be stored in a refrigerator or freezer. Some breast pumps are operated by hand, while others use electricity. Ask your lactation consultant which type will work best for you. Breast pumps can be purchased, but some hospitals and breastfeeding support groups lease breast pumps on a monthly basis. A lactation consultant can teach you how to hand express breast milk, if you prefer not to use a pump.  CARING FOR YOUR BREASTS WHILE YOU BREASTFEED Nipples can become dry, cracked, and sore while breastfeeding. The following recommendations can help keep your breasts moisturized and healthy:  Avoid using soap on your nipples.   Wear a supportive bra. Although not required, special nursing bras and tank tops are designed to allow access to your breasts for breastfeeding without taking off your entire bra or top. Avoid wearing underwire-style bras or extremely tight bras.  Air dry your nipples for 3-4minutes after each feeding.   Use only cotton bra pads to absorb leaked breast milk. Leaking of breast milk between feedings is normal.   Use lanolin on your nipples after breastfeeding. Lanolin helps to  maintain your skin's normal moisture barrier. If you use pure lanolin, you do not need to wash it off before feeding your baby again. Pure lanolin is not toxic to your baby. You may also hand express a few drops of breast milk and gently massage that milk into your nipples and allow the milk to air dry. In the first few weeks after giving birth, some women experience extremely full breasts (engorgement). Engorgement can make your breasts feel heavy, warm, and tender to the   touch. Engorgement peaks within 3-5 days after you give birth. The following recommendations can help ease engorgement:  Completely empty your breasts while breastfeeding or pumping. You may want to start by applying warm, moist heat (in the shower or with warm water-soaked hand towels) just before feeding or pumping. This increases circulation and helps the milk flow. If your baby does not completely empty your breasts while breastfeeding, pump any extra milk after he or she is finished.  Wear a snug bra (nursing or regular) or tank top for 1-2 days to signal your body to slightly decrease milk production.  Apply ice packs to your breasts, unless this is too uncomfortable for you.  Make sure that your baby is latched on and positioned properly while breastfeeding. If engorgement persists after 48 hours of following these recommendations, contact your health care provider or a lactation consultant. OVERALL HEALTH CARE RECOMMENDATIONS WHILE BREASTFEEDING  Eat healthy foods. Alternate between meals and snacks, eating 3 of each per day. Because what you eat affects your breast milk, some of the foods may make your baby more irritable than usual. Avoid eating these foods if you are sure that they are negatively affecting your baby.  Drink milk, fruit juice, and water to satisfy your thirst (about 10 glasses a day).   Rest often, relax, and continue to take your prenatal vitamins to prevent fatigue, stress, and anemia.  Continue  breast self-awareness checks.  Avoid chewing and smoking tobacco.  Avoid alcohol and drug use. Some medicines that may be harmful to your baby can pass through breast milk. It is important to ask your health care provider before taking any medicine, including all over-the-counter and prescription medicine as well as vitamin and herbal supplements. It is possible to become pregnant while breastfeeding. If birth control is desired, ask your health care provider about options that will be safe for your baby. SEEK MEDICAL CARE IF:   You feel like you want to stop breastfeeding or have become frustrated with breastfeeding.  You have painful breasts or nipples.  Your nipples are cracked or bleeding.  Your breasts are red, tender, or warm.  You have a swollen area on either breast.  You have a fever or chills.  You have nausea or vomiting.  You have drainage other than breast milk from your nipples.  Your breasts do not become full before feedings by the fifth day after you give birth.  You feel sad and depressed.  Your baby is too sleepy to eat well.  Your baby is having trouble sleeping.   Your baby is wetting less than 3 diapers in a 24-hour period.  Your baby has less than 3 stools in a 24-hour period.  Your baby's skin or the white part of his or her eyes becomes yellow.   Your baby is not gaining weight by 5 days of age. SEEK IMMEDIATE MEDICAL CARE IF:   Your baby is overly tired (lethargic) and does not want to wake up and feed.  Your baby develops an unexplained fever. Document Released: 08/18/2005 Document Revised: 08/23/2013 Document Reviewed: 02/09/2013 Exit[employer] Patient Information 2015 [employer]. This information is not intended to replace advice given to you by your health care provider. Make sure you discuss any questions you have with your health care provider.  

## 2014-05-28 MED ORDER — IBUPROFEN 600 MG PO TABS: 600.0000 mg | ORAL_TABLET | Freq: Four times a day (QID) | ORAL | Status: DC

## 2014-05-28 MED ORDER — FERROUS SULFATE 325 (65 FE) MG PO TABS: 325.0000 mg | ORAL_TABLET | Freq: Two times a day (BID) | ORAL | Status: DC

## 2014-05-28 MED ORDER — OXYCODONE-ACETAMINOPHEN 5-325 MG PO TABS: 1.0000 | ORAL_TABLET | ORAL | Status: DC | PRN

## 2014-05-30 ENCOUNTER — Other Ambulatory Visit: Payer: Self-pay | Admitting: Obstetrics & Gynecology

## 2014-05-30 ENCOUNTER — Other Ambulatory Visit: Payer: Self-pay

## 2014-05-30 DIAGNOSIS — N649 Disorder of breast, unspecified: Secondary | ICD-10-CM

## 2014-05-30 DIAGNOSIS — Z803 Family history of malignant neoplasm of breast: Secondary | ICD-10-CM

## 2014-05-30 DIAGNOSIS — R2231 Localized swelling, mass and lump, right upper limb: Secondary | ICD-10-CM

## 2014-05-31 NOTE — Progress Notes (Signed)
Post discharge chart review completed.  

## 2014-06-05 NOTE — Discharge Summary (Signed)
Patient was advised to follow up at the office for right axillary mass ultrasound and follow up. Dr. Sallye OberKulwa.

## 2014-06-15 ENCOUNTER — Ambulatory Visit
Admission: RE | Admit: 2014-06-15 | Discharge: 2014-06-15 | Disposition: A | Discharge status: Home / Self Care | Payer: Medicaid Other | Source: Ambulatory Visit | Attending: Obstetrics & Gynecology | Admitting: Obstetrics & Gynecology

## 2014-06-15 DIAGNOSIS — R2231 Localized swelling, mass and lump, right upper limb: Secondary | ICD-10-CM

## 2014-06-15 DIAGNOSIS — Z803 Family history of malignant neoplasm of breast: Secondary | ICD-10-CM

## 2014-07-03 ENCOUNTER — Encounter (HOSPITAL_COMMUNITY): Payer: Self-pay | Admitting: Obstetrics & Gynecology

## 2014-08-30 DIAGNOSIS — B009 Herpesviral infection, unspecified: Secondary | ICD-10-CM | POA: Insufficient documentation

## 2014-08-30 DIAGNOSIS — F419 Anxiety disorder, unspecified: Secondary | ICD-10-CM | POA: Insufficient documentation

## 2014-08-30 DIAGNOSIS — D573 Sickle-cell trait: Secondary | ICD-10-CM | POA: Insufficient documentation

## 2014-08-30 DIAGNOSIS — F53 Postpartum depression: Secondary | ICD-10-CM | POA: Insufficient documentation

## 2015-06-04 ENCOUNTER — Other Ambulatory Visit: Payer: Self-pay | Admitting: Primary Care

## 2015-06-04 DIAGNOSIS — R2231 Localized swelling, mass and lump, right upper limb: Secondary | ICD-10-CM

## 2015-06-04 DIAGNOSIS — N63 Unspecified lump in unspecified breast: Secondary | ICD-10-CM

## 2015-06-14 ENCOUNTER — Other Ambulatory Visit: Payer: Medicaid Other

## 2015-06-20 ENCOUNTER — Ambulatory Visit
Admission: RE | Admit: 2015-06-20 | Discharge: 2015-06-20 | Disposition: A | Discharge status: Home / Self Care | Payer: Medicaid Other | Source: Ambulatory Visit | Attending: Primary Care | Admitting: Primary Care

## 2015-06-20 ENCOUNTER — Other Ambulatory Visit: Payer: Medicaid Other

## 2015-06-20 DIAGNOSIS — N63 Unspecified lump in unspecified breast: Secondary | ICD-10-CM

## 2015-09-01 IMAGING — US US OB COMP LESS 14 WK
1 series · 14 of 22 positions shown · non-contrast
Comparison: None.

CLINICAL DATA: Thirteen weeks bile as menstrual period. Lower
abdominal pain.

EXAM:
OBSTETRIC <14 WK ULTRASOUND
TECHNIQUE: Transabdominal ultrasound was performed for evaluation of the
gestation as well as the maternal uterus and adnexal regions.

[Series 1: us ob comp less 14 wks · 14 of 22 slices shown]
[im 1/22]
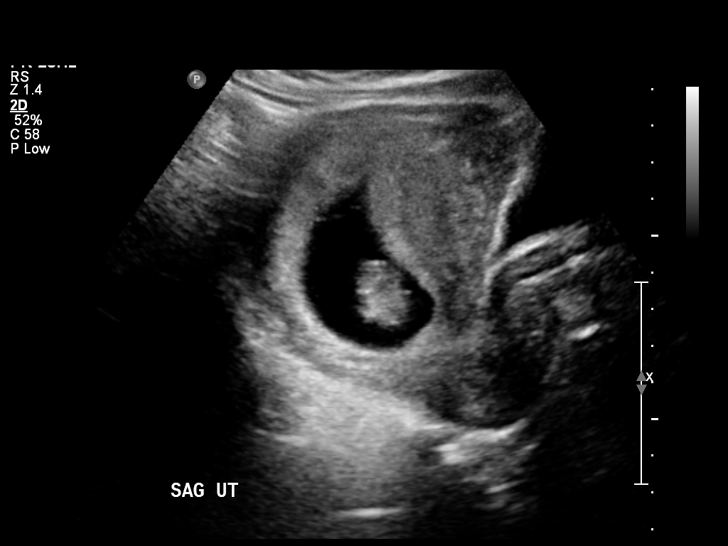
[im 3/22]
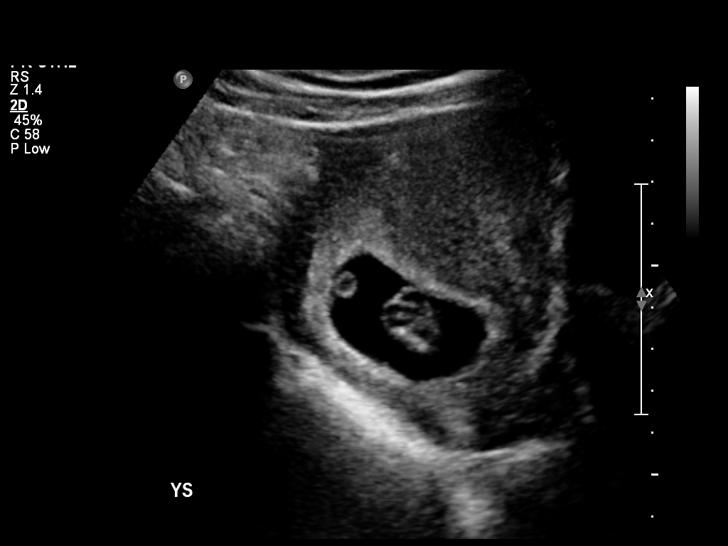
[im 4/22]
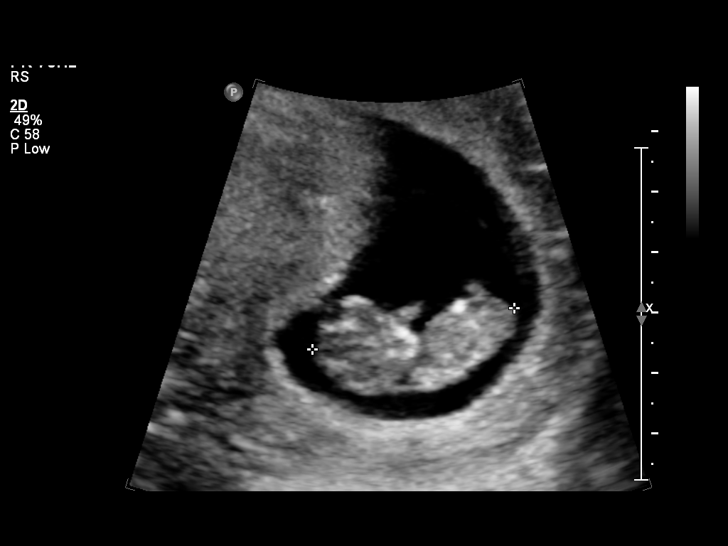
[im 6/22]
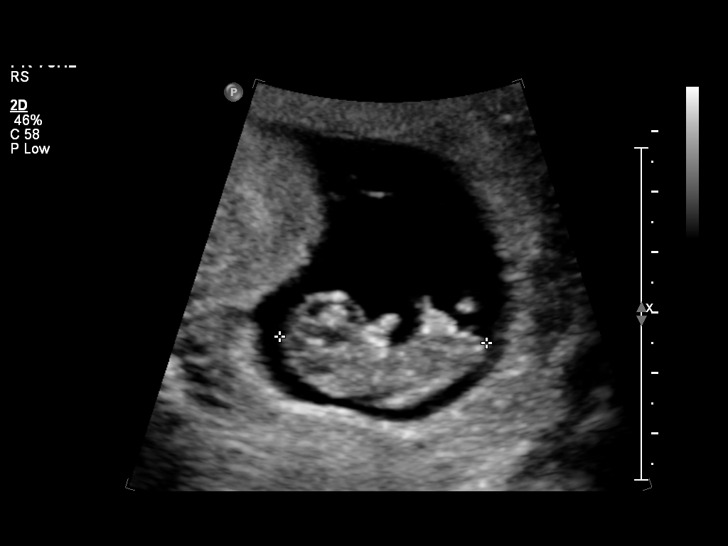
[im 8/22]
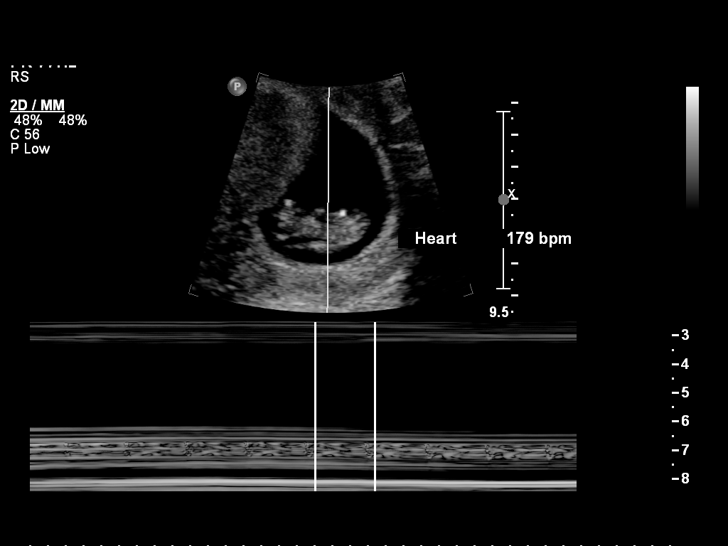
[im 9/22]
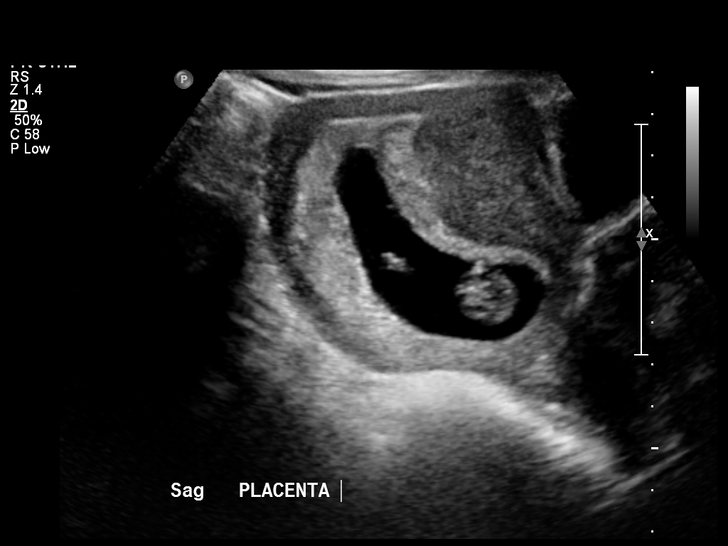
[im 11/22]
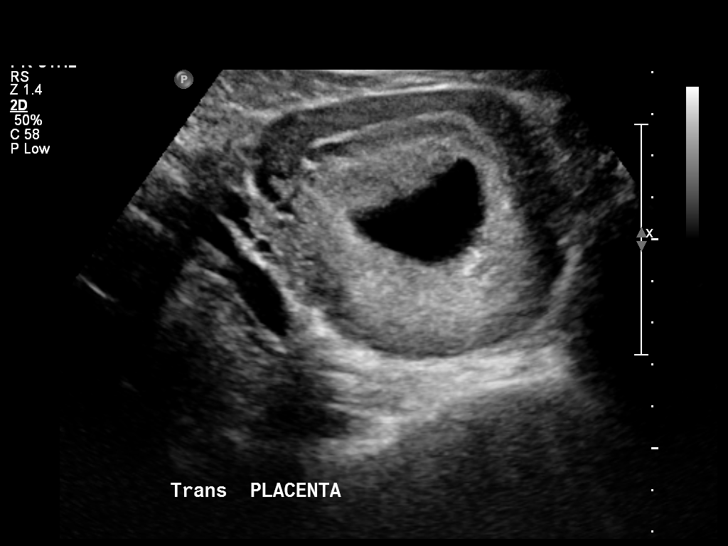
[im 12/22]
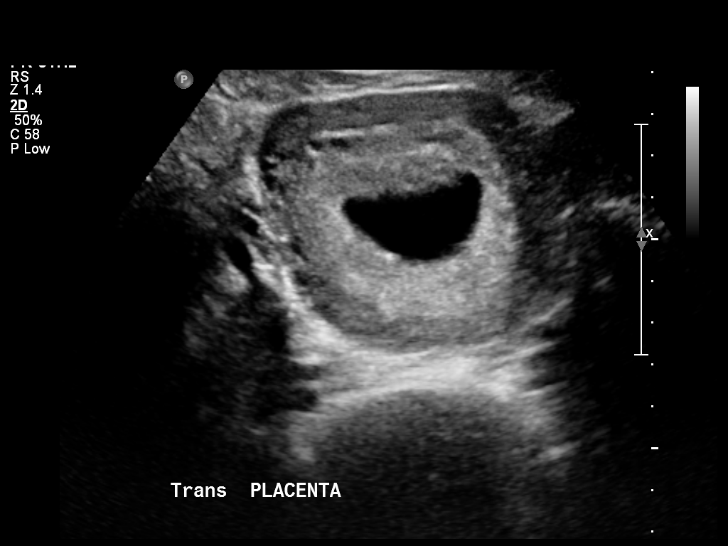
[im 14/22]
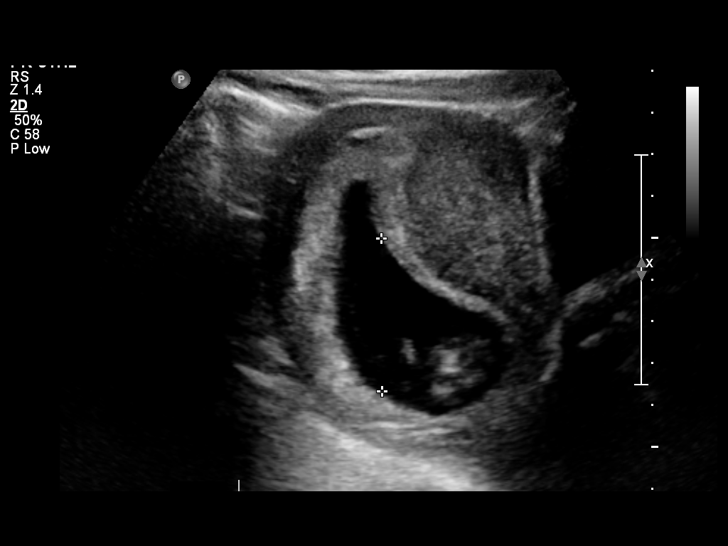
[im 15/22]
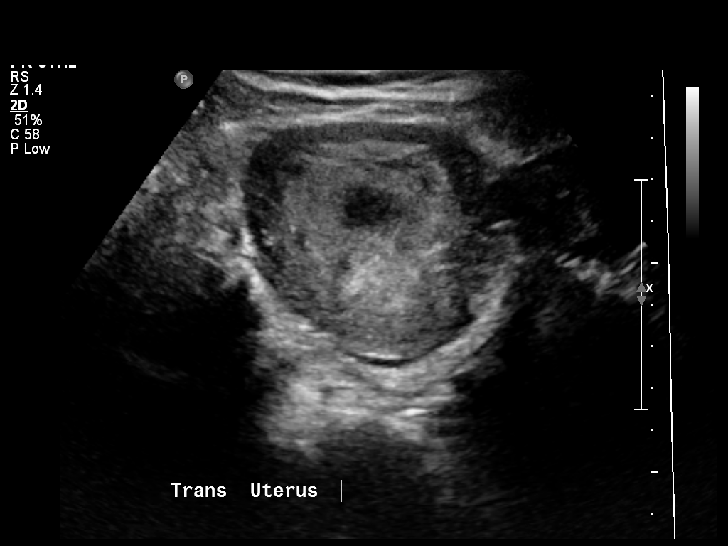
[im 17/22]
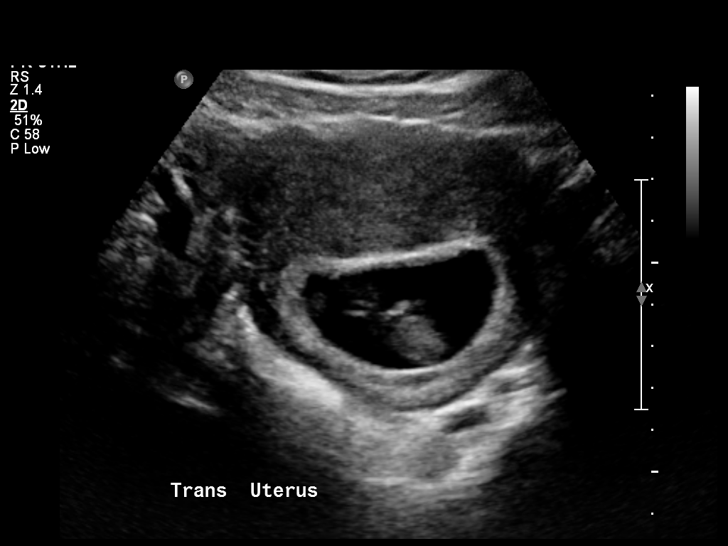
[im 19/22]
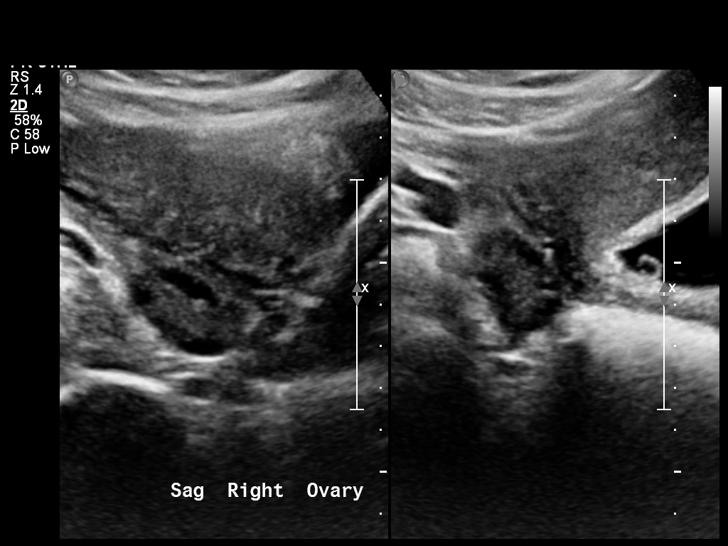
[im 20/22]
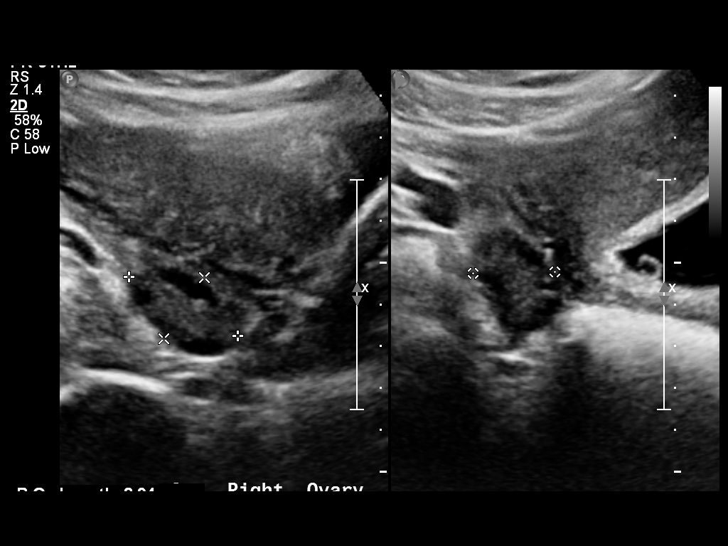
[im 22/22]
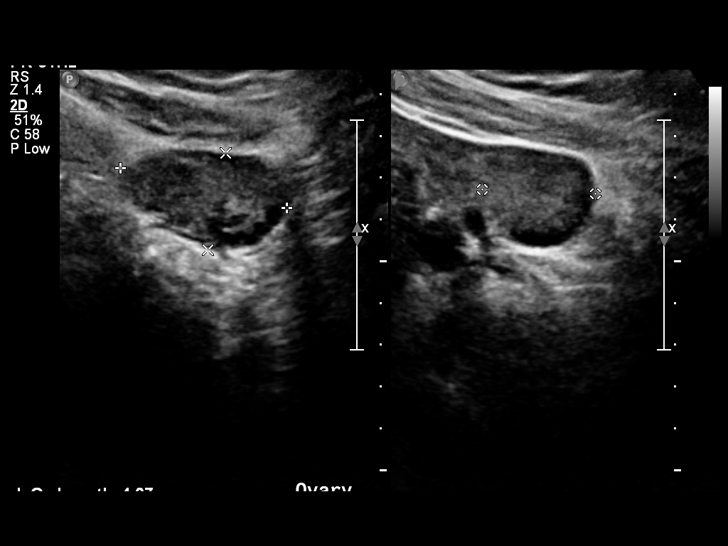

[14 of 22 positions shown; findings below may reference images not displayed]

FINDINGS: Intrauterine gestational sac: Visualized/normal in shape.

Yolk sac:  Visualized

Embryo:  Visualized

Cardiac Activity: Visualized

Heart Rate: 179 bpm

MSD:   mm    w     d

CRL:   34  mm   10 w 3 d                  US EDC: 06/01/2014

Maternal uterus/adnexae: No subchorionic hemorrhage. No adnexal
masses. No free fluid.
IMPRESSION: 10 week 3 day intrauterine pregnancy by crown-rump length. Fetal
heart rate 179 beats per min. No acute maternal findings.

## 2019-04-29 ENCOUNTER — Other Ambulatory Visit: Payer: Self-pay

## 2019-04-29 DIAGNOSIS — Z20822 Contact with and (suspected) exposure to covid-19: Secondary | ICD-10-CM

## 2019-05-01 LAB — NOVEL CORONAVIRUS, NAA: SARS-CoV-2, NAA: NOT DETECTED

## 2019-12-28 ENCOUNTER — Other Ambulatory Visit: Payer: Self-pay

## 2019-12-29 ENCOUNTER — Encounter: Payer: Self-pay | Admitting: Nurse Practitioner

## 2019-12-29 ENCOUNTER — Other Ambulatory Visit (HOSPITAL_COMMUNITY)
Admission: RE | Admit: 2019-12-29 | Discharge: 2019-12-29 | Disposition: A | Discharge status: Home / Self Care | Payer: 59 | Source: Ambulatory Visit | Attending: Nurse Practitioner | Admitting: Nurse Practitioner

## 2019-12-29 ENCOUNTER — Ambulatory Visit (INDEPENDENT_AMBULATORY_CARE_PROVIDER_SITE_OTHER): Payer: 59 | Admitting: Nurse Practitioner

## 2019-12-29 VITALS — BP 118/82 | HR 72 | Temp 97.5°F | Ht 65.0 in | Wt 181.2 lb

## 2019-12-29 DIAGNOSIS — Z124 Encounter for screening for malignant neoplasm of cervix: Secondary | ICD-10-CM | POA: Diagnosis present

## 2019-12-29 DIAGNOSIS — Z803 Family history of malignant neoplasm of breast: Secondary | ICD-10-CM

## 2019-12-29 DIAGNOSIS — Z13220 Encounter for screening for lipoid disorders: Secondary | ICD-10-CM | POA: Diagnosis not present

## 2019-12-29 DIAGNOSIS — Z0000 Encounter for general adult medical examination without abnormal findings: Secondary | ICD-10-CM | POA: Diagnosis not present

## 2019-12-29 DIAGNOSIS — Z136 Encounter for screening for cardiovascular disorders: Secondary | ICD-10-CM

## 2019-12-29 DIAGNOSIS — Z113 Encounter for screening for infections with a predominantly sexual mode of transmission: Secondary | ICD-10-CM | POA: Diagnosis present

## 2019-12-29 DIAGNOSIS — Z8619 Personal history of other infectious and parasitic diseases: Secondary | ICD-10-CM

## 2019-12-29 MED ORDER — VALACYCLOVIR HCL 1 G PO TABS: 1000.0000 mg | ORAL_TABLET | Freq: Two times a day (BID) | ORAL | 2 refills | Status: DC

## 2019-12-29 NOTE — Progress Notes (Signed)
Subjective:    Patient ID: Isabella Silva, female    DOB: Feb 01, 1991, 29 y.o.   MRN: [id number]  Patient presents today for complete physical  HPI Hx of genital herpes: Outbreak every 2-5months Not sexually active. Needs medical refill  Fhx of breast cancer (PGM and aunt), will like referral for genetic counseling.  Sexual History (orientation,birth control, marital status, STD):single, not sexually active, regular menstrual cycle, no need for OCP at this time (previous use of depoProvera injections)  Depression/Suicide: Depression screen [facility] 2/9 12/29/2019  Decreased Interest 0  Down, Depressed, Hopeless 0  PHQ - 2 Score 0   Vision:up to date  Dental:up to date  Immunizations: (TDAP, Hep C screen, Pneumovax, Influenza, zoster)  Health Maintenance  Topic Date Due  . COVID-19 Vaccine (1) Never done  . Pap Smear  Never done  . Flu Shot  04/01/2020  . Tetanus Vaccine  05/26/2024  . HIV Screening  Completed   Diet:regular.  Weight:  Wt Readings from Last 3 Encounters:  12/29/19 181 lb 3.2 oz (82.2 kg)  05/25/14 196 lb (88.9 kg)  05/23/14 196 lb (88.9 kg)    Exercise:none  Fall Risk: Fall Risk  12/29/2019  Falls in the past year? 0  Number falls in past yr: 0  Injury with Fall? 0   Advanced Directive: Advanced Directives 05/23/2014  Does Patient Have a Medical Advance Directive? No  Would patient like information on creating a medical advance directive? No - patient declined information  Pre-existing out of facility DNR order (yellow form or pink MOST form) -     Medications and allergies reviewed with patient and updated if appropriate.  Patient Active Problem List   Diagnosis Date Noted  . Hx of herpes genitalis 12/30/2019  . Family history of breast cancer in first degree relative 12/30/2019  . Status post primary low transverse cesarean section--breech 05/26/2014    Current Outpatient Medications on File Prior to Visit  Medication Sig Dispense  Refill  . Prenatal Vit-Fe Fumarate-FA (PRENATAL MULTIVITAMIN) TABS tablet Take 1 tablet by mouth daily at 12 noon.     No current facility-administered medications on file prior to visit.    Past Medical History:  Diagnosis Date  . HSV infection     Past Surgical History:  Procedure Laterality Date  . CESAREAN SECTION N/A 05/25/2014   Procedure: PRIMARY CESAREAN SECTION;  Surgeon: Konrad Felix, MD;  Location: [facility];  Service: Obstetrics;  Laterality: N/A;  . WISDOM TOOTH EXTRACTION      Social History   Socioeconomic History  . Marital status: Single    Spouse name: Not on file  . Number of children: 1  . Years of education: Not on file  . Highest education level: Not on file  Occupational History  . Not on file  Tobacco Use  . Smoking status: Never Smoker  . Smokeless tobacco: Never Used  Substance and Sexual Activity  . Alcohol use: No  . Drug use: No  . Sexual activity: Not Currently    Birth control/protection: None  Other Topics Concern  . Not on file  Social History Narrative  . Not on file   Social Determinants of Health   Financial Resource Strain:   . Difficulty of Paying Living Expenses:   Food Insecurity:   . Worried About Programme researcher, broadcasting/film/video in the Last Year:   . Barista in the Last Year:   Transportation Needs:   . Lack of Transportation (  Medical):   Marland Kitchen Lack of Transportation (Non-Medical):   Physical Activity:   . Days of Exercise per Week:   . Minutes of Exercise per Session:   Stress:   . Feeling of Stress :   Social Connections:   . Frequency of Communication with Friends and Family:   . Frequency of Social Gatherings with Friends and Family:   . Attends Religious Services:   . Active Member of Clubs or Organizations:   . Attends Banker Meetings:   Marland Kitchen Marital Status:     Family History  Problem Relation Age of Onset  . Diabetes Mother   . Cancer Paternal Grandmother        breast 56  . Cancer Paternal Aunt  32       breast        Review of Systems  Constitutional: Negative for fever, malaise/fatigue and weight loss.  HENT: Negative for congestion and sore throat.   Eyes:       Negative for visual changes  Respiratory: Negative for cough and shortness of breath.   Cardiovascular: Negative for chest pain, palpitations and leg swelling.  Gastrointestinal: Negative for blood in stool, constipation, diarrhea and heartburn.  Genitourinary: Negative for dysuria, frequency and urgency.  Musculoskeletal: Negative for falls, joint pain and myalgias.  Skin: Negative for rash.  Neurological: Negative for dizziness, sensory change and headaches.  Endo/Heme/Allergies: Does not bruise/bleed easily.  Psychiatric/Behavioral: Negative for depression, substance abuse and suicidal ideas. The patient is not nervous/anxious.     Objective:   Vitals:   12/29/19 0856  BP: 118/82  Pulse: 72  Temp: (!) 97.5 F (36.4 C)  SpO2: 99%    Body mass index is 30.15 kg/m.   Physical Examination:  Physical Exam Vitals reviewed. Exam conducted with a chaperone present.  Constitutional:      General: She is not in acute distress.    Appearance: She is obese.  HENT:     Right Ear: Tympanic membrane, ear canal and external ear normal.     Left Ear: Tympanic membrane, ear canal and external ear normal.  Eyes:     General: No scleral icterus.    Extraocular Movements: Extraocular movements intact.     Conjunctiva/sclera: Conjunctivae normal.  Neck:     Thyroid: No thyroid mass, thyromegaly or thyroid tenderness.  Cardiovascular:     Rate and Rhythm: Normal rate and regular rhythm.     Pulses: Normal pulses.     Heart sounds: Normal heart sounds.  Pulmonary:     Effort: Pulmonary effort is normal.     Breath sounds: Normal breath sounds.  Chest:     Breasts: Breasts are symmetrical.        Right: Normal. No mass, nipple discharge or skin change.        Left: Normal. No mass, nipple discharge or skin  change.  Abdominal:     General: Bowel sounds are normal. There is no distension.     Palpations: Abdomen is soft.     Tenderness: There is no abdominal tenderness.  Genitourinary:    General: Normal vulva.     Labia:        Right: No rash or tenderness.        Left: No rash or tenderness.      Urethra: No prolapse.     Vagina: Vaginal discharge present. No erythema.     Cervix: Normal.     Uterus: Normal.      Adnexa:  Right adnexa normal and left adnexa normal.  Musculoskeletal:        General: No tenderness. Normal range of motion.     Cervical back: Normal range of motion and neck supple.     Right lower leg: No edema.     Left lower leg: No edema.  Lymphadenopathy:     Cervical: No cervical adenopathy.     Upper Body:     Right upper body: No supraclavicular, axillary or pectoral adenopathy.     Left upper body: No supraclavicular, axillary or pectoral adenopathy.     Lower Body: No right inguinal adenopathy. No left inguinal adenopathy.  Skin:    General: Skin is warm and dry.     Findings: No erythema.  Neurological:     Mental Status: She is alert and oriented to person, place, and time.  Psychiatric:        Judgment: Judgment normal.     ASSESSMENT and PLAN: This visit occurred during the SARS-CoV-2 public health emergency.  Safety protocols were in place, including screening questions prior to the visit, additional usage of staff PPE, and extensive cleaning of exam room while observing appropriate contact time as indicated for disinfecting solutions.   Isabella Silva was seen today for establish care.  Diagnoses and all orders for this visit:  Preventative health care -     Comprehensive metabolic panel; Future -     Lipid panel; Future -     CBC; Future -     TSH; Future  Encounter for lipid screening for cardiovascular disease -     Lipid panel; Future  Screen for STD (sexually transmitted disease) -     HIV antibody (with reflex); Future -     Cervicovaginal  ancillary only( [place]) -     RPR; Future  Encounter for Papanicolaou smear for cervical cancer screening -     Cytology - PAP( [place])  Hx of herpes genitalis -     valACYclovir (VALTREX) 1000 MG tablet; Take 1 tablet (1,000 mg total) by mouth 2 (two) times daily.  Family history of breast cancer in first degree relative -     Ambulatory referral to Genetics   No problem-specific Assessment & Plan notes found for this encounter.     Problem List Items Addressed This Visit      Other   Family history of breast cancer in first degree relative   Relevant Orders   Ambulatory referral to Genetics   Hx of herpes genitalis   Relevant Medications   valACYclovir (VALTREX) 1000 MG tablet    Other Visit Diagnoses    Preventative health care    -  Primary   Relevant Orders   Comprehensive metabolic panel   Lipid panel   CBC   TSH   Encounter for lipid screening for cardiovascular disease       Relevant Orders   Lipid panel   Screen for STD (sexually transmitted disease)       Relevant Orders   HIV antibody (with reflex)   Cervicovaginal ancillary only( [place])   RPR   Encounter for Papanicolaou smear for cervical cancer screening       Relevant Orders   Cytology - PAP( [place])       Follow up: Return if symptoms worsen or Silva to improve.  Wilfred Lacy, NP

## 2019-12-29 NOTE — Patient Instructions (Addendum)
Thank you foe choosing [facility] for your health needs.  Schedule lab appt for blood draw (need to be fasting at least 4-6hrs prior to blood draw)  You will be contacted to schedule appt with genetic counselor.   Preventive Care 61-31 Years Old, Female Preventive care refers to visits with your health care provider and lifestyle choices that can promote health and wellness. This includes:  A yearly physical exam. This may also be called an annual well check.  Regular dental visits and eye exams.  Immunizations.  Screening for certain conditions.  Healthy lifestyle choices, such as eating a healthy diet, getting regular exercise, not using drugs or products that contain nicotine and tobacco, and limiting alcohol use. What can I expect for my preventive care visit? Physical exam Your health care provider will check your:  Height and weight. This may be used to calculate body mass index (BMI), which tells if you are at a healthy weight.  Heart rate and blood pressure.  Skin for abnormal spots. Counseling Your health care provider may ask you questions about your:  Alcohol, tobacco, and drug use.  Emotional well-being.  Home and relationship well-being.  Sexual activity.  Eating habits.  Work and work Statistician.  Method of birth control.  Menstrual cycle.  Pregnancy history. What immunizations do I need?  Influenza (flu) vaccine  This is recommended every year. Tetanus, diphtheria, and pertussis (Tdap) vaccine  You may need a Td booster every 10 years. Varicella (chickenpox) vaccine  You may need this if you have not been vaccinated. Human papillomavirus (HPV) vaccine  If recommended by your health care provider, you may need three doses over 6 months. Measles, mumps, and rubella (MMR) vaccine  You may need at least one dose of MMR. You may also need a second dose. Meningococcal conjugate (MenACWY) vaccine  One dose is recommended if you are  age 73-21 years and a first-year college student living in a residence hall, or if you have one of several medical conditions. You may also need additional booster doses. Pneumococcal conjugate (PCV13) vaccine  You may need this if you have certain conditions and were not previously vaccinated. Pneumococcal polysaccharide (PPSV23) vaccine  You may need one or two doses if you smoke cigarettes or if you have certain conditions. Hepatitis A vaccine  You may need this if you have certain conditions or if you travel or work in places where you may be exposed to hepatitis A. Hepatitis B vaccine  You may need this if you have certain conditions or if you travel or work in places where you may be exposed to hepatitis B. Haemophilus influenzae type b (Hib) vaccine  You may need this if you have certain conditions. You may receive vaccines as individual doses or as more than one vaccine together in one shot (combination vaccines). Talk with your health care provider about the risks and benefits of combination vaccines. What tests do I need?  Blood tests  Lipid and cholesterol levels. These may be checked every 5 years starting at age 22.  Hepatitis C test.  Hepatitis B test. Screening  Diabetes screening. This is done by checking your blood sugar (glucose) after you have not eaten for a while (fasting).  Sexually transmitted disease (STD) testing.  BRCA-related cancer screening. This may be done if you have a family history of breast, ovarian, tubal, or peritoneal cancers.  Pelvic exam and Pap test. This may be done every 3 years starting at age 39. Starting  at age 29, this may be done every 5 years if you have a Pap test in combination with an HPV test. Talk with your health care provider about your test results, treatment options, and if necessary, the need for more tests. Follow these instructions at home: Eating and drinking   Eat a diet that includes fresh fruits and vegetables,  whole grains, lean protein, and low-fat dairy.  Take vitamin and mineral supplements as recommended by your health care provider.  Do not drink alcohol if: ? Your health care provider tells you not to drink. ? You are pregnant, may be pregnant, or are planning to become pregnant.  If you drink alcohol: ? Limit how much you have to 0-1 drink a day. ? Be aware of how much alcohol is in your drink. In the [place] one drink equals one 12 oz bottle of beer (355 mL), one 5 oz glass of wine (148 mL), or one 1 oz glass of hard liquor (44 mL). Lifestyle  Take daily care of your teeth and gums.  Stay active. Exercise for at least 30 minutes on 5 or more days each week.  Do not use any products that contain nicotine or tobacco, such as cigarettes, e-cigarettes, and chewing tobacco. If you need help quitting, ask your health care provider.  If you are sexually active, practice safe sex. Use a condom or other form of birth control (contraception) in order to prevent pregnancy and STIs (sexually transmitted infections). If you plan to become pregnant, see your health care provider for a preconception visit. What's next?  Visit your health care provider once a year for a well check visit.  Ask your health care provider how often you should have your eyes and teeth checked.  Stay up to date on all vaccines. This information is not intended to replace advice given to you by your health care provider. Make sure you discuss any questions you have with your health care provider. Document Revised: 04/29/2018 Document Reviewed: 04/29/2018 Elsevier Patient Education  2020 [employer].

## 2019-12-30 ENCOUNTER — Encounter: Payer: Self-pay | Admitting: Nurse Practitioner

## 2019-12-30 DIAGNOSIS — Z803 Family history of malignant neoplasm of breast: Secondary | ICD-10-CM | POA: Insufficient documentation

## 2019-12-30 DIAGNOSIS — Z8619 Personal history of other infectious and parasitic diseases: Secondary | ICD-10-CM | POA: Insufficient documentation

## 2019-12-30 LAB — CERVICOVAGINAL ANCILLARY ONLY
Bacterial Vaginitis (gardnerella): NEGATIVE
Candida Glabrata: NEGATIVE
Candida Vaginitis: NEGATIVE
Chlamydia: NEGATIVE
Comment: NEGATIVE
Comment: NEGATIVE
Comment: NEGATIVE
Comment: NEGATIVE
Comment: NEGATIVE
Comment: NORMAL
Neisseria Gonorrhea: NEGATIVE
Trichomonas: NEGATIVE

## 2020-01-03 LAB — CYTOLOGY - PAP
Comment: NEGATIVE
Comment: NEGATIVE
Diagnosis: NEGATIVE
HPV 16: NEGATIVE
HPV 18 / 45: NEGATIVE
High risk HPV: POSITIVE — AB

## 2020-01-04 ENCOUNTER — Other Ambulatory Visit: Payer: Self-pay

## 2020-01-05 ENCOUNTER — Telehealth: Payer: Self-pay | Admitting: Genetic Counselor

## 2020-01-05 ENCOUNTER — Other Ambulatory Visit (INDEPENDENT_AMBULATORY_CARE_PROVIDER_SITE_OTHER): Payer: 59

## 2020-01-05 DIAGNOSIS — Z113 Encounter for screening for infections with a predominantly sexual mode of transmission: Secondary | ICD-10-CM

## 2020-01-05 NOTE — Addendum Note (Signed)
Addended by: Varney Biles on: 01/05/2020 08:21 AM   Modules accepted: Orders

## 2020-01-05 NOTE — Telephone Encounter (Signed)
Received a genetic counseling appt from [facility] for fhx of breast cancer. Ms. Seman has been cld and scheduled to see Irving Burton on 5/20 at 3pm. Pt aware to arrive 15 minutes early.

## 2020-01-11 ENCOUNTER — Other Ambulatory Visit: Payer: Self-pay

## 2020-01-12 ENCOUNTER — Other Ambulatory Visit (INDEPENDENT_AMBULATORY_CARE_PROVIDER_SITE_OTHER): Payer: 59

## 2020-01-12 DIAGNOSIS — Z13220 Encounter for screening for lipoid disorders: Secondary | ICD-10-CM

## 2020-01-12 DIAGNOSIS — Z136 Encounter for screening for cardiovascular disorders: Secondary | ICD-10-CM

## 2020-01-12 DIAGNOSIS — Z0000 Encounter for general adult medical examination without abnormal findings: Secondary | ICD-10-CM | POA: Diagnosis not present

## 2020-01-12 DIAGNOSIS — Z113 Encounter for screening for infections with a predominantly sexual mode of transmission: Secondary | ICD-10-CM

## 2020-01-12 LAB — CBC
HCT: 37.7 % (ref 36.0–46.0)
Hemoglobin: 12.3 g/dL (ref 12.0–15.0)
MCHC: 32.7 g/dL (ref 30.0–36.0)
MCV: 77.5 fl — ABNORMAL LOW (ref 78.0–100.0)
Platelets: 231 10*3/uL (ref 150.0–400.0)
RBC: 4.87 Mil/uL (ref 3.87–5.11)
RDW: 14.4 % (ref 11.5–15.5)
WBC: 3.7 10*3/uL — ABNORMAL LOW (ref 4.0–10.5)

## 2020-01-12 LAB — LIPID PANEL
Cholesterol: 143 mg/dL (ref 0–200)
HDL: 48.7 mg/dL (ref >39.00)
LDL Cholesterol: 79 mg/dL (ref 0–99)
NonHDL: 94.19
Total CHOL/HDL Ratio: 3
Triglycerides: 78 mg/dL (ref 0.0–149.0)
VLDL: 15.6 mg/dL (ref 0.0–40.0)

## 2020-01-12 LAB — COMPREHENSIVE METABOLIC PANEL
ALT: 10 U/L (ref 0–35)
AST: 13 U/L (ref 0–37)
Albumin: 4.2 g/dL (ref 3.5–5.2)
Alkaline Phosphatase: 72 U/L (ref 39–117)
BUN: 9 mg/dL (ref 6–23)
CO2: 29 mEq/L (ref 19–32)
Calcium: 8.7 mg/dL (ref 8.4–10.5)
Chloride: 103 mEq/L (ref 96–112)
Creatinine, Ser: 0.8 mg/dL (ref 0.40–1.20)
GFR: 101.62 mL/min (ref >60.00)
Glucose, Bld: 93 mg/dL (ref 70–99)
Potassium: 4 mEq/L (ref 3.5–5.1)
Sodium: 138 mEq/L (ref 135–145)
Total Bilirubin: 0.3 mg/dL (ref 0.2–1.2)
Total Protein: 7 g/dL (ref 6.0–8.3)

## 2020-01-12 LAB — TSH: TSH: 1.67 u[IU]/mL (ref 0.35–4.50)

## 2020-01-13 LAB — HIV ANTIBODY (ROUTINE TESTING W REFLEX): HIV 1&2 Ab, 4th Generation: NONREACTIVE

## 2020-01-13 LAB — RPR: RPR Ser Ql: NONREACTIVE

## 2020-01-19 ENCOUNTER — Inpatient Hospital Stay: Payer: 59 | Admitting: Genetic Counselor

## 2020-01-19 ENCOUNTER — Inpatient Hospital Stay: Payer: 59

## 2020-02-16 ENCOUNTER — Other Ambulatory Visit: Payer: 59

## 2020-02-16 ENCOUNTER — Encounter: Payer: 59 | Admitting: Genetic Counselor

## 2020-03-21 ENCOUNTER — Encounter: Payer: Self-pay | Admitting: Genetic Counselor

## 2020-03-22 ENCOUNTER — Encounter: Payer: 59 | Admitting: Genetic Counselor

## 2020-03-22 ENCOUNTER — Other Ambulatory Visit: Payer: 59

## 2020-04-26 ENCOUNTER — Inpatient Hospital Stay: Payer: 59

## 2020-04-26 ENCOUNTER — Inpatient Hospital Stay: Payer: 59 | Admitting: Genetic Counselor

## 2020-05-15 ENCOUNTER — Telehealth: Payer: Self-pay | Admitting: Genetic Counselor

## 2020-05-15 NOTE — Telephone Encounter (Signed)
Patient requested to cancel her genetic counseling appointment on May 17, 2020 due to financial reasons.  Discussed patient assistance programs for cost.  Patient requested to proceed with canceling and may call to request another appointment in the future.

## 2020-05-17 ENCOUNTER — Inpatient Hospital Stay: Payer: Medicaid Other

## 2020-05-17 ENCOUNTER — Inpatient Hospital Stay: Payer: Medicaid Other | Admitting: Genetic Counselor

## 2020-10-04 ENCOUNTER — Other Ambulatory Visit: Payer: Self-pay

## 2020-10-05 ENCOUNTER — Ambulatory Visit (INDEPENDENT_AMBULATORY_CARE_PROVIDER_SITE_OTHER): Payer: 59 | Admitting: Nurse Practitioner

## 2020-10-05 ENCOUNTER — Other Ambulatory Visit (HOSPITAL_COMMUNITY)
Admission: RE | Admit: 2020-10-05 | Discharge: 2020-10-05 | Disposition: A | Discharge status: Home / Self Care | Payer: 59 | Source: Ambulatory Visit | Attending: Nurse Practitioner | Admitting: Nurse Practitioner

## 2020-10-05 ENCOUNTER — Encounter: Payer: Self-pay | Admitting: Nurse Practitioner

## 2020-10-05 ENCOUNTER — Other Ambulatory Visit: Payer: Self-pay

## 2020-10-05 VITALS — BP 120/80 | Ht 65.0 in | Wt 181.4 lb

## 2020-10-05 DIAGNOSIS — N760 Acute vaginitis: Secondary | ICD-10-CM | POA: Diagnosis not present

## 2020-10-05 MED ORDER — METRONIDAZOLE 0.75 % VA GEL: 1.0000 | Freq: Every day | VAGINAL | 0 refills | Status: DC

## 2020-10-05 NOTE — Progress Notes (Signed)
Subjective:  Patient ID: Isabella Silva, female    DOB: 01-03-1991  Age: 30 y.o. MRN: [id number]  CC: Acute Visit (Pt c/o discharge x 2 weeks, pt states after her cycle ended last week she noticed she still had discharge and would like to be checked. )  Vaginal Discharge The patient's primary symptoms include a genital odor and vaginal discharge. The patient's pertinent negatives include no genital itching, genital lesions, genital rash, missed menses, pelvic pain or vaginal bleeding. This is a new problem. The current episode started 1 to 4 weeks ago. The problem occurs constantly. The problem has been waxing and waning. Pertinent negatives include no abdominal pain, anorexia, back pain, chills, constipation, dysuria, fever, flank pain, frequency, hematuria, nausea, painful intercourse, rash, sore throat or urgency. The vaginal discharge was thick and milky. The vaginal bleeding is typical of menses. She has not been passing clots. She has not been passing tissue. Nothing aggravates the symptoms. She has tried nothing for the symptoms. She is sexually active. It is unknown whether or not her partner has an STD. She uses nothing for contraception. Her menstrual history has been regular. Her past medical history is significant for vaginosis. There is no history of herpes simplex, PID or an STD.   Reviewed past Medical, Social and Family history today.  Outpatient Medications Prior to Visit  Medication Sig Dispense Refill  . valACYclovir (VALTREX) 1000 MG tablet Take 1 tablet (1,000 mg total) by mouth 2 (two) times daily. 20 tablet 2  . Prenatal Vit-Fe Fumarate-FA (PRENATAL MULTIVITAMIN) TABS tablet Take 1 tablet by mouth daily at 12 noon. (Patient not taking: Reported on 10/05/2020)     No facility-administered medications prior to visit.    ROS See HPI  Objective:  BP 120/80 (BP Location: Left Arm, Patient Position: Sitting, Cuff Size: Normal)   Ht 5\' 5"  (1.651 m)   Wt 181 lb 6.4 oz (82.3  kg)   BMI 30.19 kg/m   Physical Exam Vitals reviewed. Exam conducted with a chaperone present.  Genitourinary:    Labia:        Right: No rash, tenderness or lesion.        Left: No rash, tenderness or lesion.      Vagina: Vaginal discharge and erythema present. No tenderness.     Adnexa:        Right: No tenderness or fullness.         Left: No tenderness or fullness.       Comments: Unable to visualize cervix Lymphadenopathy:     Lower Body: No right inguinal adenopathy. No left inguinal adenopathy.  Skin:    Findings: No rash.  Neurological:     Mental Status: She is alert and oriented to person, place, and time.    Assessment & Plan:  This visit occurred during the SARS-CoV-2 public health emergency.  Safety protocols were in place, including screening questions prior to the visit, additional usage of staff PPE, and extensive cleaning of exam room while observing appropriate contact time as indicated for disinfecting solutions.   Alailah was seen today for acute visit.  Diagnoses and all orders for this visit:  Acute vaginitis -     Cervicovaginal ancillary only( [place]) -     metroNIDAZOLE (METROGEL VAGINAL) 0.75 % vaginal gel; Place 1 Applicatorful vaginally at bedtime. X 5days -     HIV antibody (with reflex) -     RPR   Problem List Items Addressed This Visit  None   Visit Diagnoses    Acute vaginitis    -  Primary   Relevant Medications   metroNIDAZOLE (METROGEL VAGINAL) 0.75 % vaginal gel   Other Relevant Orders   Cervicovaginal ancillary only( )   HIV antibody (with reflex)   RPR      Follow-up: Return in about 3 months (around 12/27/2020) for CPE( fasting, breast and pelvic exam).  Alysia Penna, NP

## 2020-10-07 LAB — CERVICOVAGINAL ANCILLARY ONLY
Bacterial Vaginitis (gardnerella): POSITIVE — AB
Candida Glabrata: NEGATIVE
Candida Vaginitis: NEGATIVE
Chlamydia: NEGATIVE
Comment: NEGATIVE
Comment: NEGATIVE
Comment: NEGATIVE
Comment: NEGATIVE
Comment: NEGATIVE
Comment: NORMAL
Neisseria Gonorrhea: NEGATIVE
Trichomonas: NEGATIVE

## 2020-10-08 LAB — RPR: RPR Ser Ql: NONREACTIVE

## 2020-10-08 LAB — HIV ANTIBODY (ROUTINE TESTING W REFLEX): HIV 1&2 Ab, 4th Generation: NONREACTIVE

## 2020-11-19 ENCOUNTER — Other Ambulatory Visit: Payer: Self-pay

## 2020-11-20 ENCOUNTER — Ambulatory Visit (INDEPENDENT_AMBULATORY_CARE_PROVIDER_SITE_OTHER): Payer: 59 | Admitting: Nurse Practitioner

## 2020-11-20 ENCOUNTER — Encounter: Payer: Self-pay | Admitting: Nurse Practitioner

## 2020-11-20 VITALS — BP 120/76 | HR 80 | Temp 97.5°F | Ht 65.0 in | Wt 180.8 lb

## 2020-11-20 DIAGNOSIS — L42 Pityriasis rosea: Secondary | ICD-10-CM | POA: Diagnosis not present

## 2020-11-20 DIAGNOSIS — S0300XA Dislocation of jaw, unspecified side, initial encounter: Secondary | ICD-10-CM

## 2020-11-20 MED ORDER — METHYLPREDNISOLONE 4 MG PO TBPK: ORAL_TABLET | ORAL | 0 refills | Status: DC

## 2020-11-20 MED ORDER — CYCLOBENZAPRINE HCL 5 MG PO TABS: 5.0000 mg | ORAL_TABLET | Freq: Every day | ORAL | 0 refills | Status: DC

## 2020-11-20 MED ORDER — CETIRIZINE HCL 10 MG PO TABS: 10.0000 mg | ORAL_TABLET | Freq: Two times a day (BID) | ORAL | 0 refills | Status: DC

## 2020-11-20 NOTE — Progress Notes (Signed)
Subjective:  Patient ID: [name], female    DOB: [birth year]  Age: 30 y.o. MRN: [id number]  CC: Acute Visit (Pt c/o rash on breast and stomach x 1 month. Pt describes rash as small, itching bumps. )  Rash This is a new problem. The current episode started 1 to 4 weeks ago. The problem has been gradually worsening since onset. The affected locations include the torso. The rash is characterized by itchiness and redness. She was exposed to nothing. Pertinent negatives include no anorexia, congestion, cough, eye pain, facial edema, fatigue, fever, joint pain, nail changes, rhinorrhea, shortness of breath or sore throat. Past treatments include topical steroids. The treatment provided mild relief. There is no history of allergies, asthma, eczema or varicella.  no recent URI Sexually active with use of condoms, LMP 10/30/2020. Denies any signs of vaginitis or pelvic pain.  She also mentioned bilateral jaw clicking with talking, chewing and opening mouth wide. She denies any jaw injury or recent dental procedure. This is not associated with pain or headache or teeth sensitivity.  Reviewed past Medical, Social and Family history today.  Outpatient Medications Prior to Visit  Medication Sig Dispense Refill  . spironolactone (ALDACTONE) 50 MG tablet Take 50 mg by mouth daily.    . valACYclovir (VALTREX) 1000 MG tablet Take 1 tablet (1,000 mg total) by mouth 2 (two) times daily. (Patient not taking: Reported on 11/20/2020) 20 tablet 2  . metroNIDAZOLE (METROGEL VAGINAL) 0.75 % vaginal gel Place 1 Applicatorful vaginally at bedtime. X 5days (Patient not taking: Reported on 11/20/2020) 70 g 0  . Prenatal Vit-Fe Fumarate-FA (PRENATAL MULTIVITAMIN) TABS tablet Take 1 tablet by mouth daily at 12 noon. (Patient not taking: No sig reported)     No facility-administered medications prior to visit.    ROS See HPI  Objective:  BP 120/76 (BP Location: Left Arm, Patient Position: Sitting, Cuff Size:  Normal)   Pulse 80   Temp (!) 97.5 F (36.4 C) (Temporal)   Ht 5\' 5"  (1.651 m)   Wt 180 lb 12.8 oz (82 kg)   BMI 30.09 kg/m   Physical Exam HENT:     Head: Normocephalic.     Jaw: Trismus present. No tenderness, swelling, pain on movement or malocclusion.     Salivary Glands: Right salivary gland is not tender. Left salivary gland is not diffusely enlarged or tender.     Comments: Bilateral palpable clicking over TMJ, no tenderness    Right Ear: Tympanic membrane, ear canal and external ear normal.     Left Ear: Tympanic membrane, ear canal and external ear normal.     Nose:     Right Sinus: No maxillary sinus tenderness or frontal sinus tenderness.     Left Sinus: No maxillary sinus tenderness or frontal sinus tenderness.     Mouth/Throat:     Mouth: Mucous membranes are moist.     Pharynx: No posterior oropharyngeal erythema.  Musculoskeletal:     Cervical back: Normal range of motion and neck supple.  Lymphadenopathy:     Cervical: No cervical adenopathy.  Skin:    Findings: Rash present. Rash is scaling.          Comments: Multiple small discrete scaly patches.  Neurological:     Mental Status: She is alert.    Assessment & Plan:  This visit occurred during the SARS-CoV-2 public health emergency.  Safety protocols were in place, including screening questions prior to the visit, additional usage of staff PPE,  and extensive cleaning of exam room while observing appropriate contact time as indicated for disinfecting solutions.   Annaya was seen today for acute visit.  Diagnoses and all orders for this visit:  Pityriasis rosea -     methylPREDNISolone (MEDROL DOSEPAK) 4 MG TBPK tablet; Take as directed on package -     cetirizine (ZYRTEC) 10 MG tablet; Take 1 tablet (10 mg total) by mouth 2 (two) times daily.  TMJ (dislocation of temporomandibular joint), initial encounter -     cyclobenzaprine (FLEXERIL) 5 MG tablet; Take 1-2 tablets (5-10 mg total) by mouth at  bedtime.  Avoid NSAIDs while taking oral prednisone to prevent gastric irritation. Start printed jaw exercise and cold compress as indicated. Use mouth guard at bedtime Schedule appt with dentist if no improvement in 1-2weeks  Problem List Items Addressed This Visit   None   Visit Diagnoses    Pityriasis rosea    -  Primary   Relevant Medications   methylPREDNISolone (MEDROL DOSEPAK) 4 MG TBPK tablet   cetirizine (ZYRTEC) 10 MG tablet   TMJ (dislocation of temporomandibular joint), initial encounter       Relevant Medications   cyclobenzaprine (FLEXERIL) 5 MG tablet      Follow-up: Return if symptoms worsen or fail to improve.  Alysia Penna, NP

## 2020-11-20 NOTE — Patient Instructions (Signed)
Maintain adequate moisturization with unscented cream Avoid hot showers  Schedule appt with dentist if no improvement with jaw clicking in 1-2weeks.  Jaw Range of Motion Exercises Jaw range of motion exercises are exercises that help your jaw move better. Exercises that help you have good posture (postural exercises) also help relieve jaw discomfort. These are often done along with range of motion exercises. These exercises can help prevent or improve:  Difficulty opening your mouth.  Pain in your jaw while it is open or closed.  Temporomandibular joint (TMJ) pain.  Headache caused by jaw tension. Take other actions to prevent or relieve jaw pain, such as:  Avoiding things that cause or increase jaw pain. This may include: ? Chewing gum or eating hard foods. ? Clenching your jaw or teeth, grinding your teeth, or keeping tension in your jaw muscles. ? Opening your mouth wide, such as for a big yawn. ? Leaning on your jaw, such as resting your jaw in your hand while leaning on a desk.  Putting ice on your jaw. ? Put ice in a plastic bag. ? Place a towel between your skin and the bag. ? Leave the ice on for 10-15 minutes, 2-3 times a day. Only do jaw exercises that your health care provider approves of. Only move your jaw as far as it can comfortably go in each direction. Do not move your jaw into positions that cause pain. Range of motion exercises Repeat each of these exercises 8 times, 1-2 times a day, or as told by your health care provider. Exercise A: Forward protrusion 1. Push your jaw forward. Hold this position for 1-2 seconds. 2. Allow your jaw to return to its normal position and rest it there for 1-2 seconds. Exercise B: Controlled opening 1. Stand or sit in front of a mirror. Place your tongue on the roof of your mouth, just behind your top teeth. 2. Keeping your tongue on the roof of your mouth, slowly open and close your mouth. 3. While you open and close your mouth,  watch your jaw in the mirror. Try to keep your jaw from moving to one side or the other. Exercise C: Right and left motion 1. Move your jaw right. Hold this position for 1-2 seconds. Allow your jaw to return to its normal position, and rest it there for 1-2 seconds. 2. Move your jaw left. Hold this position for 1-2 seconds. Allow your jaw to return to its normal position, and rest it there for 1-2 seconds. Postural exercises Exercise A: Chin tucks 1. You can do this exercise sitting, standing, or lying down. 2. Move your head straight back, keeping your head level. You can guide the movement by placing your fingers on your chin to push your jaw back in an even motion. You should be able to feel a double chin form at the end of the motion. 3. Hold this position for 5 seconds. Repeat 10-15 times. Exercise B: Shoulder blade squeeze 1. Sit or stand. 2. Bend your elbows to about 90 degrees, which is the shape of a capital letter "L." Keep your upper arms by your body. 3. Squeeze your shoulder blades down and back, as though you were trying to touch your elbows behind you. Do not shrug your shoulders or move your head. 4. Hold this position for 5 seconds. Repeat 10-15 times. Exercise C: Chest stretch 1. Stand facing a corner. 2. Put both of your hands and your forearms on the wall, with your arms wide  apart. 3. Make sure your arms are at a 90-degree angle to your body. This means that you should hold your arms straight out from your body, level with the floor. 4. Step in toward the corner. Do not lean in. 5. Hold this position for 30 seconds. Repeat 3 times. Contact a health care provider if you have:  Jaw pain that is new or gets worse.  Clicking or popping sounds while doing the exercises. Get help right away if:  Your jaw is stuck in one place and you cannot move it.  You cannot open or close your mouth. Summary  Jaw range of motion exercises are exercises that help your jaw move  better.  Take actions to prevent or relieve jaw pain: limit chewing gum or eating hard foods; clenching your jaw or teeth; or leaning on your jaw, such as resting your jaw in your hand while leaning on a desk.  Repeat each of the jaw range of motion exercises 8 times, 1-2 times a day, or as told by your health care provider.  Contact a health care provider if you have clicking or popping sounds while doing the exercises. This information is not intended to replace advice given to you by your health care provider. Make sure you discuss any questions you have with your health care provider. Document Revised: 05/11/2020 Document Reviewed: 05/11/2020 Elsevier Patient Education  2021 [employer].

## 2020-12-25 ENCOUNTER — Other Ambulatory Visit: Payer: Self-pay

## 2020-12-26 ENCOUNTER — Ambulatory Visit: Payer: 59 | Admitting: Nurse Practitioner

## 2020-12-27 ENCOUNTER — Ambulatory Visit (INDEPENDENT_AMBULATORY_CARE_PROVIDER_SITE_OTHER): Payer: 59 | Admitting: Nurse Practitioner

## 2020-12-27 ENCOUNTER — Other Ambulatory Visit: Payer: Self-pay

## 2020-12-27 ENCOUNTER — Encounter: Payer: Self-pay | Admitting: Nurse Practitioner

## 2020-12-27 ENCOUNTER — Other Ambulatory Visit (HOSPITAL_COMMUNITY)
Admission: RE | Admit: 2020-12-27 | Discharge: 2020-12-27 | Disposition: A | Discharge status: Home / Self Care | Payer: 59 | Source: Ambulatory Visit | Attending: Nurse Practitioner | Admitting: Nurse Practitioner

## 2020-12-27 VITALS — BP 116/64 | Temp 97.8°F | Ht 65.0 in | Wt 184.2 lb

## 2020-12-27 DIAGNOSIS — Z113 Encounter for screening for infections with a predominantly sexual mode of transmission: Secondary | ICD-10-CM

## 2020-12-27 DIAGNOSIS — N760 Acute vaginitis: Secondary | ICD-10-CM

## 2020-12-27 LAB — POCT URINE PREGNANCY: Preg Test, Ur: NEGATIVE

## 2020-12-27 LAB — URINE CYTOLOGY ANCILLARY ONLY
Chlamydia: NEGATIVE
Comment: NEGATIVE
Comment: NEGATIVE
Comment: NORMAL
Neisseria Gonorrhea: NEGATIVE
Trichomonas: NEGATIVE

## 2020-12-27 MED ORDER — FLUCONAZOLE 150 MG PO TABS: 150.0000 mg | ORAL_TABLET | Freq: Every day | ORAL | 0 refills | Status: DC

## 2020-12-27 NOTE — Patient Instructions (Signed)
Go to lab for urine collection.   

## 2020-12-27 NOTE — Progress Notes (Signed)
Subjective:  Patient ID: Isabella Silva, female    DOB: [birth year]  Age: 30 y.o. MRN: [id number]  CC: Acute Visit (Pt c/o discharge, itching and burning x 1 week. /Pt has tried Monistat and it has helped some.)  Vaginal Discharge The patient's primary symptoms include genital itching and vaginal discharge. The patient's pertinent negatives include no genital odor, genital rash, pelvic pain or vaginal bleeding. This is a new problem. The current episode started in the past 7 days. The problem occurs constantly. The problem has been unchanged. She is not pregnant. Associated symptoms include dysuria. Pertinent negatives include no frequency, painful intercourse or urgency. The vaginal discharge was white. The vaginal bleeding is typical of menses. She has not been passing clots. She has not been passing tissue. Nothing aggravates the symptoms. She has tried antifungals for the symptoms. The treatment provided no relief. She is sexually active. It is unknown whether or not her partner has an STD. She uses nothing for contraception. Her menstrual history has been regular. Her past medical history is significant for vaginosis.   Reviewed past Medical, Social and Family history today.  Outpatient Medications Prior to Visit  Medication Sig Dispense Refill  . cetirizine (ZYRTEC) 10 MG tablet Take 1 tablet (10 mg total) by mouth 2 (two) times daily. 30 tablet 0  . cyclobenzaprine (FLEXERIL) 5 MG tablet Take 1-2 tablets (5-10 mg total) by mouth at bedtime. 14 tablet 0  . spironolactone (ALDACTONE) 50 MG tablet Take 50 mg by mouth daily.    . methylPREDNISolone (MEDROL DOSEPAK) 4 MG TBPK tablet Take as directed on package (Patient not taking: Reported on 12/27/2020) 21 tablet 0  . valACYclovir (VALTREX) 1000 MG tablet Take 1 tablet (1,000 mg total) by mouth 2 (two) times daily. (Patient not taking: No sig reported) 20 tablet 2   No facility-administered medications prior to visit.    ROS See  HPI  Objective:  BP 116/64 (BP Location: Left Arm, Patient Position: Sitting, Cuff Size: Normal)   Temp 97.8 F (36.6 C) (Temporal)   Ht 5\' 5"  (1.651 m)   Wt 184 lb 3.2 oz (83.6 kg)   LMP 12/27/2020   BMI 30.65 kg/m   Physical Exam Vitals reviewed. Exam conducted with a chaperone present.  Genitourinary:    Labia:        Right: No rash.        Left: No rash.      Vagina: Bleeding present.     Comments: Unable to perform pelvic exam due to excessive blood in vagina. 1st of of cycle Neurological:     Mental Status: She is alert.    Assessment & Plan:  This visit occurred during the SARS-CoV-2 public health emergency.  Safety protocols were in place, including screening questions prior to the visit, additional usage of staff PPE, and extensive cleaning of exam room while observing appropriate contact time as indicated for disinfecting solutions.   Olia was seen today for acute visit.  Diagnoses and all orders for this visit:  Acute vaginitis -     fluconazole (DIFLUCAN) 150 MG tablet; Take 1 tablet (150 mg total) by mouth daily. Take second tab 3days apart from first tab -     POCT urine pregnancy  Screen for STD (sexually transmitted disease) -     Urine cytology ancillary only([place])   Problem List Items Addressed This Visit   None   Visit Diagnoses    Acute vaginitis    -  Primary  Relevant Medications   fluconazole (DIFLUCAN) 150 MG tablet   Other Relevant Orders   POCT urine pregnancy (Completed)   Screen for STD (sexually transmitted disease)       Relevant Orders   Urine cytology ancillary only([place]) (Completed)      Follow-up: No follow-ups on file.  Alysia Penna, NP

## 2020-12-28 ENCOUNTER — Encounter: Payer: Self-pay | Admitting: Nurse Practitioner

## 2021-01-01 ENCOUNTER — Other Ambulatory Visit: Payer: Self-pay | Admitting: Nurse Practitioner

## 2021-01-01 DIAGNOSIS — S0300XA Dislocation of jaw, unspecified side, initial encounter: Secondary | ICD-10-CM

## 2021-01-03 ENCOUNTER — Other Ambulatory Visit: Payer: Self-pay

## 2021-01-04 ENCOUNTER — Other Ambulatory Visit: Payer: Self-pay

## 2021-01-04 ENCOUNTER — Ambulatory Visit (INDEPENDENT_AMBULATORY_CARE_PROVIDER_SITE_OTHER): Payer: 59 | Admitting: Nurse Practitioner

## 2021-01-04 ENCOUNTER — Other Ambulatory Visit (HOSPITAL_COMMUNITY)
Admission: RE | Admit: 2021-01-04 | Discharge: 2021-01-04 | Disposition: A | Discharge status: Home / Self Care | Payer: 59 | Source: Ambulatory Visit | Attending: Nurse Practitioner | Admitting: Nurse Practitioner

## 2021-01-04 ENCOUNTER — Encounter: Payer: Self-pay | Admitting: Nurse Practitioner

## 2021-01-04 VITALS — BP 122/80 | HR 82 | Temp 97.3°F | Ht 65.0 in | Wt 183.0 lb

## 2021-01-04 DIAGNOSIS — A6009 Herpesviral infection of other urogenital tract: Secondary | ICD-10-CM

## 2021-01-04 DIAGNOSIS — Z13220 Encounter for screening for lipoid disorders: Secondary | ICD-10-CM

## 2021-01-04 DIAGNOSIS — Z136 Encounter for screening for cardiovascular disorders: Secondary | ICD-10-CM | POA: Diagnosis not present

## 2021-01-04 DIAGNOSIS — Z124 Encounter for screening for malignant neoplasm of cervix: Secondary | ICD-10-CM | POA: Diagnosis present

## 2021-01-04 DIAGNOSIS — Z0000 Encounter for general adult medical examination without abnormal findings: Secondary | ICD-10-CM

## 2021-01-04 DIAGNOSIS — R87810 Cervical high risk human papillomavirus (HPV) DNA test positive: Secondary | ICD-10-CM | POA: Insufficient documentation

## 2021-01-04 LAB — CBC
HCT: 38.2 % (ref 36.0–46.0)
Hemoglobin: 12.7 g/dL (ref 12.0–15.0)
MCHC: 33.3 g/dL (ref 30.0–36.0)
MCV: 75 fl — ABNORMAL LOW (ref 78.0–100.0)
Platelets: 279 10*3/uL (ref 150.0–400.0)
RBC: 5.09 Mil/uL (ref 3.87–5.11)
RDW: 15.6 % — ABNORMAL HIGH (ref 11.5–15.5)
WBC: 3.9 10*3/uL — ABNORMAL LOW (ref 4.0–10.5)

## 2021-01-04 LAB — COMPREHENSIVE METABOLIC PANEL
ALT: 12 U/L (ref 0–35)
AST: 15 U/L (ref 0–37)
Albumin: 4.5 g/dL (ref 3.5–5.2)
Alkaline Phosphatase: 78 U/L (ref 39–117)
BUN: 10 mg/dL (ref 6–23)
CO2: 31 mEq/L (ref 19–32)
Calcium: 9.5 mg/dL (ref 8.4–10.5)
Chloride: 101 mEq/L (ref 96–112)
Creatinine, Ser: 0.89 mg/dL (ref 0.40–1.20)
GFR: 86.4 mL/min (ref >60.00)
Glucose, Bld: 85 mg/dL (ref 70–99)
Potassium: 3.9 mEq/L (ref 3.5–5.1)
Sodium: 139 mEq/L (ref 135–145)
Total Bilirubin: 0.4 mg/dL (ref 0.2–1.2)
Total Protein: 7.8 g/dL (ref 6.0–8.3)

## 2021-01-04 LAB — LIPID PANEL
Cholesterol: 160 mg/dL (ref 0–200)
HDL: 55.4 mg/dL (ref >39.00)
LDL Cholesterol: 88 mg/dL (ref 0–99)
NonHDL: 104.48
Total CHOL/HDL Ratio: 3
Triglycerides: 83 mg/dL (ref 0.0–149.0)
VLDL: 16.6 mg/dL (ref 0.0–40.0)

## 2021-01-04 MED ORDER — VALACYCLOVIR HCL 1 G PO TABS: 1000.0000 mg | ORAL_TABLET | Freq: Two times a day (BID) | ORAL | 2 refills | Status: DC

## 2021-01-04 NOTE — Patient Instructions (Signed)
Go to lab for blood draw  Start DASH diet and daily exercise.  Preventive Care 67-32 Years Old, Female Preventive care refers to lifestyle choices and visits with your health care provider that can promote health and wellness. This includes:  A yearly physical exam. This is also called an annual wellness visit.  Regular dental and eye exams.  Immunizations.  Screening for certain conditions.  Healthy lifestyle choices, such as: ? Eating a healthy diet. ? Getting regular exercise. ? Not using drugs or products that contain nicotine and tobacco. ? Limiting alcohol use. What can I expect for my preventive care visit? Physical exam Your health care provider may check your:  Height and weight. These may be used to calculate your BMI (body mass index). BMI is a measurement that tells if you are at a healthy weight.  Heart rate and blood pressure.  Body temperature.  Skin for abnormal spots. Counseling Your health care provider may ask you questions about your:  Past medical problems.  Family's medical history.  Alcohol, tobacco, and drug use.  Emotional well-being.  Home life and relationship well-being.  Sexual activity.  Diet, exercise, and sleep habits.  Work and work Statistician.  Access to firearms.  Method of birth control.  Menstrual cycle.  Pregnancy history. What immunizations do I need? Vaccines are usually given at various ages, according to a schedule. Your health care provider will recommend vaccines for you based on your age, medical history, and lifestyle or other factors, such as travel or where you work.   What tests do I need? Blood tests  Lipid and cholesterol levels. These may be checked every 5 years starting at age 32.  Hepatitis C test.  Hepatitis B test. Screening  Diabetes screening. This is done by checking your blood sugar (glucose) after you have not eaten for a while (fasting).  STD (sexually transmitted disease) testing,  if you are at risk.  BRCA-related cancer screening. This may be done if you have a family history of breast, ovarian, tubal, or peritoneal cancers.  Pelvic exam and Pap test. This may be done every 3 years starting at age 32. Starting at age 32, this may be done every 5 years if you have a Pap test in combination with an HPV test. Talk with your health care provider about your test results, treatment options, and if necessary, the need for more tests.   Follow these instructions at home: Eating and drinking  Eat a healthy diet that includes fresh fruits and vegetables, whole grains, lean protein, and low-fat dairy products.  Take vitamin and mineral supplements as recommended by your health care provider.  Do not drink alcohol if: ? Your health care provider tells you not to drink. ? You are pregnant, may be pregnant, or are planning to become pregnant.  If you drink alcohol: ? Limit how much you have to 0-1 drink a day. ? Be aware of how much alcohol is in your drink. In the [place] one drink equals one 12 oz bottle of beer (355 mL), one 5 oz glass of wine (148 mL), or one 1 oz glass of hard liquor (44 mL).   Lifestyle  Take daily care of your teeth and gums. Brush your teeth every morning and night with fluoride toothpaste. Floss one time each day.  Stay active. Exercise for at least 30 minutes 5 or more days each week.  Do not use any products that contain nicotine or tobacco, such as cigarettes, e-cigarettes, and chewing  tobacco. If you need help quitting, ask your health care provider.  Do not use drugs.  If you are sexually active, practice safe sex. Use a condom or other form of protection to prevent STIs (sexually transmitted infections).  If you do not wish to become pregnant, use a form of birth control. If you plan to become pregnant, see your health care provider for a prepregnancy visit.  Find healthy ways to cope with stress, such as: ? Meditation, yoga, or listening  to music. ? Journaling. ? Talking to a trusted person. ? Spending time with friends and family. Safety  Always wear your seat belt while driving or riding in a vehicle.  Do not drive: ? If you have been drinking alcohol. Do not ride with someone who has been drinking. ? When you are tired or distracted. ? While texting.  Wear a helmet and other protective equipment during sports activities.  If you have firearms in your house, make sure you follow all gun safety procedures.  Seek help if you have been physically or sexually abused. What's next?  Go to your health care provider once a year for an annual wellness visit.  Ask your health care provider how often you should have your eyes and teeth checked.  Stay up to date on all vaccines. This information is not intended to replace advice given to you by your health care provider. Make sure you discuss any questions you have with your health care provider. Document Revised: 04/16/2031 Document Reviewed: 04/29/2018 Elsevier Patient Education  2021 [employer].

## 2021-01-04 NOTE — Progress Notes (Signed)
Subjective:    Patient ID: Isabella Silva, female    DOB: 1991-03-28, 30 y.o.   MRN: [id number]  Patient presents today for CPE   HPI  Sexual History (orientation,birth control, marital status, STD):pelvic and breast exam completed today, denies need for repeat STD screen. Resolved vaginal discharge.  Depression/Suicide: Depression screen [facility] 2/9 01/04/2021 12/29/2019  Decreased Interest 0 0  Down, Depressed, Hopeless 0 0  PHQ - 2 Score 0 0  Altered sleeping 0 -  Tired, decreased energy 2 -  Change in appetite 0 -  Feeling bad or failure about yourself  0 -  Trouble concentrating 0 -  Moving slowly or fidgety/restless 0 -  Suicidal thoughts 0 -  PHQ-9 Score 2 -  Difficult doing work/chores Not difficult at all -   Vision:up to date  Dental:up to date  Immunizations: (TDAP, Hep C screen, Pneumovax, Influenza, zoster)  Health Maintenance  Topic Date Due  . COVID-19 Vaccine (3 - Booster for Moderna series) 12/14/2020  . Hepatitis C Screening: USPSTF Recommendation to screen - Ages 18-79 yo.  01/04/2022*  . Flu Shot  04/01/2021  . Pap Smear  12/29/2022  . Tetanus Vaccine  05/26/2024  . HIV Screening  Completed  . HPV Vaccine  Aged Out  *Topic was postponed. The date shown is not the original due date.   Diet:regular. Exercise: none Weight:  Wt Readings from Last 3 Encounters:  01/04/21 183 lb (83 kg)  12/27/20 184 lb 3.2 oz (83.6 kg)  11/20/20 180 lb 12.8 oz (82 kg)   Fall Risk: Fall Risk  01/04/2021 12/29/2019  Falls in the past year? 0 0  Number falls in past yr: 0 0  Injury with Fall? 0 0  Risk for fall due to : No Fall Risks -  Follow up Falls evaluation completed -   Medications and allergies reviewed with patient and updated if appropriate.  Patient Active Problem List   Diagnosis Date Noted  . Papanicolaou smear of cervix with positive high risk human papilloma virus (HPV) test 01/04/2021  . Hx of herpes genitalis 12/30/2019  . Family history of  breast cancer in first degree relative 12/30/2019  . Anxiety 08/30/2014  . Herpes simplex 08/30/2014  . Postpartum depression 08/30/2014  . Sickle cell trait (HCC) 08/30/2014  . Status post primary low transverse cesarean section--breech 05/26/2014    Current Outpatient Medications on File Prior to Visit  Medication Sig Dispense Refill  . cetirizine (ZYRTEC) 10 MG tablet Take 1 tablet (10 mg total) by mouth 2 (two) times daily. 30 tablet 0  . cyclobenzaprine (FLEXERIL) 5 MG tablet Take 1 tablet (5 mg total) by mouth at bedtime as needed. 14 tablet 0  . spironolactone (ALDACTONE) 50 MG tablet Take 50 mg by mouth daily.     No current facility-administered medications on file prior to visit.    Past Medical History:  Diagnosis Date  . HSV infection     Past Surgical History:  Procedure Laterality Date  . CESAREAN SECTION N/A 05/25/2014   Procedure: PRIMARY CESAREAN SECTION;  Surgeon: Konrad Felix, MD;  Location: [facility];  Service: Obstetrics;  Laterality: N/A;  . WISDOM TOOTH EXTRACTION      Social History   Socioeconomic History  . Marital status: Single    Spouse name: Not on file  . Number of children: 1  . Years of education: Not on file  . Highest education level: Not on file  Occupational History  . Not  on file  Tobacco Use  . Smoking status: Never Smoker  . Smokeless tobacco: Never Used  Vaping Use  . Vaping Use: Never used  Substance and Sexual Activity  . Alcohol use: No  . Drug use: No  . Sexual activity: Not Currently    Birth control/protection: None  Other Topics Concern  . Not on file  Social History Narrative  . Not on file   Social Determinants of Health   Financial Resource Strain: Not on file  Food Insecurity: Not on file  Transportation Needs: Not on file  Physical Activity: Not on file  Stress: Not on file  Social Connections: Not on file    Family History  Problem Relation Age of Onset  . Diabetes Mother   . Cancer Paternal  Grandmother        breast 56  . Cancer Paternal Aunt 42       breast        Review of Systems  Constitutional: Negative for fever, malaise/fatigue and weight loss.  HENT: Negative for congestion and sore throat.   Eyes:       Negative for visual changes  Respiratory: Negative for cough and shortness of breath.   Cardiovascular: Negative for chest pain, palpitations and leg swelling.  Gastrointestinal: Negative for blood in stool, constipation, diarrhea and heartburn.  Genitourinary: Negative for dysuria, frequency and urgency.  Musculoskeletal: Negative for falls, joint pain and myalgias.  Skin: Negative for rash.  Neurological: Negative for dizziness, sensory change and headaches.  Endo/Heme/Allergies: Does not bruise/bleed easily.  Psychiatric/Behavioral: Negative for depression, substance abuse and suicidal ideas. The patient is not nervous/anxious.     Objective:   Vitals:   01/04/21 0829  BP: 122/80  Pulse: 82  Temp: (!) 97.3 F (36.3 C)  SpO2: 97%    Body mass index is 30.45 kg/m.   Physical Examination:  Physical Exam Vitals and nursing note reviewed. Exam conducted with a chaperone present.  Constitutional:      General: She is not in acute distress.    Appearance: She is well-developed. She is obese.  HENT:     Right Ear: Tympanic membrane, ear canal and external ear normal.     Left Ear: Tympanic membrane, ear canal and external ear normal.  Eyes:     Extraocular Movements: Extraocular movements intact.     Conjunctiva/sclera: Conjunctivae normal.  Cardiovascular:     Rate and Rhythm: Normal rate and regular rhythm.     Heart sounds: Normal heart sounds.  Pulmonary:     Effort: Pulmonary effort is normal. No respiratory distress.     Breath sounds: Normal breath sounds.  Chest:     Chest wall: No tenderness.  Breasts:     Right: Normal. No axillary adenopathy or supraclavicular adenopathy.     Left: Normal. No axillary adenopathy or  supraclavicular adenopathy.    Abdominal:     General: Bowel sounds are normal.     Palpations: Abdomen is soft.     Hernia: There is no hernia in the left inguinal area or right inguinal area.  Genitourinary:    General: Normal vulva.     Labia:        Right: No rash or tenderness.        Left: No rash or tenderness.      Vagina: Normal. No vaginal discharge.     Cervix: Normal.     Uterus: Normal.      Adnexa: Right adnexa normal and left  adnexa normal.  Musculoskeletal:        General: Normal range of motion.     Cervical back: Normal range of motion and neck supple.     Right lower leg: No edema.     Left lower leg: No edema.  Lymphadenopathy:     Cervical: No cervical adenopathy.     Upper Body:     Right upper body: No supraclavicular, axillary or pectoral adenopathy.     Left upper body: No supraclavicular, axillary or pectoral adenopathy.     Lower Body: No right inguinal adenopathy. No left inguinal adenopathy.  Skin:    General: Skin is warm and dry.  Neurological:     Mental Status: She is alert and oriented to person, place, and time.     Deep Tendon Reflexes: Reflexes are normal and symmetric.  Psychiatric:        Mood and Affect: Mood normal.        Behavior: Behavior normal.        Thought Content: Thought content normal.     ASSESSMENT and PLAN: This visit occurred during the SARS-CoV-2 public health emergency.  Safety protocols were in place, including screening questions prior to the visit, additional usage of staff PPE, and extensive cleaning of exam room while observing appropriate contact time as indicated for disinfecting solutions.   Gift was seen today for annual exam.  Diagnoses and all orders for this visit:  Preventative health care -     CBC -     Comprehensive metabolic panel -     Lipid panel -     Cytology - PAP( [place])  Encounter for lipid screening for cardiovascular disease -     Lipid panel  Encounter for Papanicolaou  smear for cervical cancer screening -     Cytology - PAP( [place])  Herpes simplex of female genitalia -     valACYclovir (VALTREX) 1000 MG tablet; Take 1 tablet (1,000 mg total) by mouth 2 (two) times daily.      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  Alysia Penna, NP

## 2021-01-07 LAB — CYTOLOGY - PAP
Comment: NEGATIVE
Diagnosis: NEGATIVE
High risk HPV: NEGATIVE

## 2021-02-04 ENCOUNTER — Ambulatory Visit: Payer: 59 | Admitting: Nurse Practitioner

## 2021-03-08 ENCOUNTER — Encounter: Payer: Self-pay | Admitting: Family Medicine

## 2021-03-08 ENCOUNTER — Other Ambulatory Visit: Payer: Self-pay

## 2021-03-08 ENCOUNTER — Ambulatory Visit (INDEPENDENT_AMBULATORY_CARE_PROVIDER_SITE_OTHER): Payer: 59 | Admitting: Family Medicine

## 2021-03-08 VITALS — BP 120/68 | HR 75 | Temp 98.4°F | Ht 65.0 in | Wt 187.2 lb

## 2021-03-08 DIAGNOSIS — Z3200 Encounter for pregnancy test, result unknown: Secondary | ICD-10-CM

## 2021-03-08 DIAGNOSIS — Z3A01 Less than 8 weeks gestation of pregnancy: Secondary | ICD-10-CM

## 2021-03-08 LAB — POCT URINE PREGNANCY: Preg Test, Ur: POSITIVE — AB

## 2021-03-08 MED ORDER — NATACHEW 28-1 MG PO CHEW: CHEWABLE_TABLET | ORAL | 5 refills | Status: DC

## 2021-03-08 NOTE — Progress Notes (Signed)
Established Patient Office Visit  Subjective:  Patient ID: Isabella Silva, female    DOB: Oct 29, 1990  Age: 30 y.o. MRN: [id number]  CC:  Chief Complaint  Patient presents with   Follow-up    Pt c/o missed period, wants a pregnancy test    HPI Isabella Silva presents for amenorrhea.  She thinks that she may be pregnant.  LMP was 526.  She is using no contraception.  She is G1 P1-0-0-1.  She has a 98-year-old son.  She is currently taking no medications.  She does not smoke, drink alcohol or use illicit drugs.  Father of baby is here with her today.  Past Medical History:  Diagnosis Date   HSV infection     Past Surgical History:  Procedure Laterality Date   CESAREAN SECTION N/A 05/25/2014   Procedure: PRIMARY CESAREAN SECTION;  Surgeon: Konrad Felix, MD;  Location: [facility];  Service: Obstetrics;  Laterality: N/A;   WISDOM TOOTH EXTRACTION      Family History  Problem Relation Age of Onset   Diabetes Mother    Cancer Paternal Grandmother        breast 19   Cancer Paternal Aunt 59       breast    Social History   Socioeconomic History   Marital status: Single    Spouse name: Not on file   Number of children: 1   Years of education: Not on file   Highest education level: Not on file  Occupational History   Not on file  Tobacco Use   Smoking status: Never   Smokeless tobacco: Never  Vaping Use   Vaping Use: Never used  Substance and Sexual Activity   Alcohol use: No   Drug use: No   Sexual activity: Not Currently    Birth control/protection: None  Other Topics Concern   Not on file  Social History Narrative   Not on file   Social Determinants of Health   Financial Resource Strain: Not on file  Food Insecurity: Not on file  Transportation Needs: Not on file  Physical Activity: Not on file  Stress: Not on file  Social Connections: Not on file  Intimate Partner Violence: Not on file    Outpatient Medications Prior to Visit  Medication Sig  Dispense Refill   valACYclovir (VALTREX) 1000 MG tablet Take 1 tablet (1,000 mg total) by mouth 2 (two) times daily. 20 tablet 2   cyclobenzaprine (FLEXERIL) 5 MG tablet Take 1 tablet (5 mg total) by mouth at bedtime as needed. (Patient not taking: Reported on 03/08/2021) 14 tablet 0   spironolactone (ALDACTONE) 50 MG tablet Take 50 mg by mouth daily. (Patient not taking: Reported on 03/08/2021)     cetirizine (ZYRTEC) 10 MG tablet Take 1 tablet (10 mg total) by mouth 2 (two) times daily. (Patient not taking: Reported on 03/08/2021) 30 tablet 0   No facility-administered medications prior to visit.    No Known Allergies  ROS Review of Systems    Objective:    Physical Exam  BP 120/68   Pulse 75   Temp 98.4 F (36.9 C) (Oral)   Ht 5\' 5"  (1.651 m)   Wt 187 lb 3.2 oz (84.9 kg)   LMP 01/24/2021 (Exact Date)   SpO2 99%   BMI 31.15 kg/m  Wt Readings from Last 3 Encounters:  03/08/21 187 lb 3.2 oz (84.9 kg)  01/04/21 183 lb (83 kg)  12/27/20 184 lb 3.2 oz (83.6 kg)  Health Maintenance Due  Topic Date Due   Pneumococcal Vaccine 77-71 Years old (1 - PCV) Never done   COVID-19 Vaccine (3 - Booster for Moderna series) 11/13/2020    There are no preventive care reminders to display for this patient.  Lab Results  Component Value Date   TSH 1.67 01/12/2020   Lab Results  Component Value Date   WBC 3.9 (L) 01/04/2021   HGB 12.7 01/04/2021   HCT 38.2 01/04/2021   MCV 75.0 (L) 01/04/2021   PLT 279.0 01/04/2021   Lab Results  Component Value Date   NA 139 01/04/2021   K 3.9 01/04/2021   CO2 31 01/04/2021   GLUCOSE 85 01/04/2021   BUN 10 01/04/2021   CREATININE 0.89 01/04/2021   BILITOT 0.4 01/04/2021   ALKPHOS 78 01/04/2021   AST 15 01/04/2021   ALT 12 01/04/2021   PROT 7.8 01/04/2021   ALBUMIN 4.5 01/04/2021   CALCIUM 9.5 01/04/2021   GFR 86.40 01/04/2021   Lab Results  Component Value Date   CHOL 160 01/04/2021   Lab Results  Component Value Date   HDL  55.40 01/04/2021   Lab Results  Component Value Date   LDLCALC 88 01/04/2021   Lab Results  Component Value Date   TRIG 83.0 01/04/2021   Lab Results  Component Value Date   CHOLHDL 3 01/04/2021   No results found for: HGBA1C    Assessment & Plan:   Problem List Items Addressed This Visit   None Visit Diagnoses     Encounter for pregnancy test, result unknown    -  Primary   Relevant Medications   Prenatal Vit-Fe Fum-Fe Bisg-FA (NATACHEW) 28-1 MG CHEW   Other Relevant Orders   POCT urine pregnancy (Completed)   Less than [redacted] weeks gestation of pregnancy       Relevant Orders   Ambulatory referral to Obstetrics / Gynecology       Meds ordered this encounter  Medications   Prenatal Vit-Fe Fum-Fe Bisg-FA (NATACHEW) 28-1 MG CHEW    Sig: Chew one tablet daily.    Dispense:  30 tablet    Refill:  5    Follow-up: No follow-ups on file.  Calculated EDC of 3/2. Gestational age of [redacted] weeks and 1 day.  Recent blood work did show a low normal hemoglobin with microcytosis.  Advised mom to have plenty of iron in her diet.  Prenatal vitamin with iron was prescribed.  Referral to OB.  Mliss Sax, MD

## 2021-03-11 ENCOUNTER — Telehealth: Payer: Self-pay | Admitting: Obstetrics and Gynecology

## 2021-03-11 NOTE — Telephone Encounter (Signed)
Spoke with patient about getting scheduled with [place] to start prenatal care. She stated she had not made up her mind rather she wanted to come to this office , or her previous OB GYN provider. She stated she would call back if she wanted to schedule at this office.

## 2021-03-13 ENCOUNTER — Other Ambulatory Visit: Payer: Self-pay | Admitting: Family Medicine

## 2021-03-13 DIAGNOSIS — Z3200 Encounter for pregnancy test, result unknown: Secondary | ICD-10-CM

## 2021-03-13 NOTE — Telephone Encounter (Signed)
Pharmacy comment: Alternative Requested:COPAY IS 137$, PT WANTS ALTERNATIVE WHICH IS CHEAPER. For the prenatal chew.   Please reviewa nd advise.  Thanks. Dm/cma

## 2021-03-18 ENCOUNTER — Other Ambulatory Visit: Payer: Self-pay | Admitting: Family Medicine

## 2021-03-18 DIAGNOSIS — Z3200 Encounter for pregnancy test, result unknown: Secondary | ICD-10-CM

## 2021-03-19 ENCOUNTER — Other Ambulatory Visit: Payer: Self-pay | Admitting: Family Medicine

## 2021-03-19 DIAGNOSIS — Z3200 Encounter for pregnancy test, result unknown: Secondary | ICD-10-CM

## 2021-03-26 LAB — OB RESULTS CONSOLE ANTIBODY SCREEN: Antibody Screen: NEGATIVE

## 2021-03-26 LAB — OB RESULTS CONSOLE GC/CHLAMYDIA
Chlamydia: NEGATIVE
Gonorrhea: NEGATIVE

## 2021-03-26 LAB — OB RESULTS CONSOLE RPR: RPR: NONREACTIVE

## 2021-03-26 LAB — OB RESULTS CONSOLE HEPATITIS B SURFACE ANTIGEN: Hepatitis B Surface Ag: NEGATIVE

## 2021-03-26 LAB — OB RESULTS CONSOLE ABO/RH: RH Type: POSITIVE

## 2021-03-26 LAB — OB RESULTS CONSOLE HIV ANTIBODY (ROUTINE TESTING): HIV: NONREACTIVE

## 2021-03-26 LAB — OB RESULTS CONSOLE RUBELLA ANTIBODY, IGM: Rubella: IMMUNE

## 2021-08-19 ENCOUNTER — Other Ambulatory Visit: Payer: Self-pay | Admitting: Obstetrics & Gynecology

## 2021-10-11 ENCOUNTER — Encounter (HOSPITAL_COMMUNITY): Payer: Self-pay

## 2021-10-11 NOTE — Patient Instructions (Signed)
Isabella Silva  10/11/2021   Your procedure is scheduled on:  10/25/2021  Arrive at 1:15 PM at Entrance C on [address] at [facility]  and [address]. You are invited to use the FREE valet parking or use the Visitor's parking deck.  Pick up the phone at the desk and dial [phone].  Call this number if you have problems the morning of surgery: [phone]  Remember:   Do not eat food:(After Midnight) Desps de medianoche.  Do not drink clear liquids: (6 Hours before arrival) 6 horas ante llegada.  Take these medicines the morning of surgery with A SIP OF WATER:  valtrex   Do not wear jewelry, make-up or nail polish.  Do not wear lotions, powders, or perfumes. Do not wear deodorant.  Do not shave 48 hours prior to surgery.  Do not bring valuables to the hospital.  [facility] is not   responsible for any belongings or valuables brought to the hospital.  Contacts, dentures or bridgework may not be worn into surgery.  Leave suitcase in the car. After surgery it may be brought to your room.  For patients admitted to the hospital, checkout time is 11:00 AM the day of              discharge.      Please read over the following fact sheets that you were given:     Preparing for Surgery

## 2021-10-15 ENCOUNTER — Inpatient Hospital Stay (HOSPITAL_COMMUNITY)
Admission: AD | Admit: 2021-10-15 | Discharge: 2021-10-15 | Disposition: A | Discharge status: Home / Self Care | Payer: Medicaid Other | Attending: Obstetrics & Gynecology | Admitting: Obstetrics & Gynecology

## 2021-10-15 ENCOUNTER — Other Ambulatory Visit: Payer: Self-pay

## 2021-10-15 ENCOUNTER — Encounter (HOSPITAL_COMMUNITY): Payer: Self-pay | Admitting: Obstetrics & Gynecology

## 2021-10-15 DIAGNOSIS — O26893 Other specified pregnancy related conditions, third trimester: Secondary | ICD-10-CM | POA: Insufficient documentation

## 2021-10-15 DIAGNOSIS — M545 Low back pain, unspecified: Secondary | ICD-10-CM

## 2021-10-15 DIAGNOSIS — Y92410 Unspecified street and highway as the place of occurrence of the external cause: Secondary | ICD-10-CM | POA: Diagnosis not present

## 2021-10-15 DIAGNOSIS — Z3A37 37 weeks gestation of pregnancy: Secondary | ICD-10-CM | POA: Insufficient documentation

## 2021-10-15 LAB — CBC WITH DIFFERENTIAL/PLATELET
Abs Immature Granulocytes: 0.09 10*3/uL — ABNORMAL HIGH (ref 0.00–0.07)
Basophils Absolute: 0 10*3/uL (ref 0.0–0.1)
Basophils Relative: 0 %
Eosinophils Absolute: 0.1 10*3/uL (ref 0.0–0.5)
Eosinophils Relative: 1 %
HCT: 42.9 % (ref 36.0–46.0)
Hemoglobin: 13.8 g/dL (ref 12.0–15.0)
Immature Granulocytes: 1 %
Lymphocytes Relative: 21 %
Lymphs Abs: 1.7 10*3/uL (ref 0.7–4.0)
MCH: 25.7 pg — ABNORMAL LOW (ref 26.0–34.0)
MCHC: 32.2 g/dL (ref 30.0–36.0)
MCV: 79.7 fL — ABNORMAL LOW (ref 80.0–100.0)
Monocytes Absolute: 0.7 10*3/uL (ref 0.1–1.0)
Monocytes Relative: 8 %
Neutro Abs: 5.6 10*3/uL (ref 1.7–7.7)
Neutrophils Relative %: 69 %
Platelets: 259 10*3/uL (ref 150–400)
RBC: 5.38 MIL/uL — ABNORMAL HIGH (ref 3.87–5.11)
RDW: 15.4 % (ref 11.5–15.5)
WBC: 8.1 10*3/uL (ref 4.0–10.5)
nRBC: 0 % (ref 0.0–0.2)

## 2021-10-15 LAB — COMPREHENSIVE METABOLIC PANEL
ALT: 24 U/L (ref 0–44)
AST: 25 U/L (ref 15–41)
Albumin: 3.1 g/dL — ABNORMAL LOW (ref 3.5–5.0)
Alkaline Phosphatase: 131 U/L — ABNORMAL HIGH (ref 38–126)
Anion gap: 10 (ref 5–15)
BUN: 6 mg/dL (ref 6–20)
CO2: 21 mmol/L — ABNORMAL LOW (ref 22–32)
Calcium: 9.7 mg/dL (ref 8.9–10.3)
Chloride: 104 mmol/L (ref 98–111)
Creatinine, Ser: 0.86 mg/dL (ref 0.44–1.00)
GFR, Estimated: >60 mL/min (ref >60)
Glucose, Bld: 91 mg/dL (ref 70–99)
Potassium: 3.9 mmol/L (ref 3.5–5.1)
Sodium: 135 mmol/L (ref 135–145)
Total Bilirubin: 0.3 mg/dL (ref 0.3–1.2)
Total Protein: 7.2 g/dL (ref 6.5–8.1)

## 2021-10-15 LAB — PROTEIN / CREATININE RATIO, URINE
Creatinine, Urine: 75.33 mg/dL
Protein Creatinine Ratio: 0.21 mg/mg{Cre} — ABNORMAL HIGH (ref 0.00–0.15)
Total Protein, Urine: 16 mg/dL

## 2021-10-15 LAB — URINALYSIS, ROUTINE W REFLEX MICROSCOPIC
Bilirubin Urine: NEGATIVE
Glucose, UA: NEGATIVE mg/dL
Hgb urine dipstick: NEGATIVE
Ketones, ur: NEGATIVE mg/dL
Leukocytes,Ua: NEGATIVE
Nitrite: NEGATIVE
Protein, ur: NEGATIVE mg/dL
Specific Gravity, Urine: 1.011 (ref 1.005–1.030)
pH: 6 (ref 5.0–8.0)

## 2021-10-15 MED ORDER — ACETAMINOPHEN 325 MG PO TABS
650.0000 mg | ORAL_TABLET | Freq: Once | ORAL | Status: AC
  Administered 2021-10-15: 650 mg via ORAL
  Filled 2021-10-15: qty 2
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
## 2021-10-15 NOTE — Progress Notes (Signed)
Cleared medically by Dr Myrtis Ser.  To MAU for further monitoring.
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
## 2021-10-15 NOTE — Discharge Instructions (Signed)
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
## 2021-10-15 NOTE — ED Notes (Signed)
Pt states she feels movement of fetus

## 2021-10-16 LAB — TYPE AND SCREEN
ABO/RH(D): O POS
Antibody Screen: NEGATIVE

## 2021-10-23 ENCOUNTER — Other Ambulatory Visit: Payer: Self-pay | Admitting: Obstetrics & Gynecology

## 2021-10-23 ENCOUNTER — Other Ambulatory Visit (HOSPITAL_COMMUNITY)
Admission: RE | Admit: 2021-10-23 | Discharge: 2021-10-23 | Disposition: A | Discharge status: Home / Self Care | Payer: Medicaid Other | Source: Ambulatory Visit | Attending: Obstetrics & Gynecology | Admitting: Obstetrics & Gynecology

## 2021-10-23 ENCOUNTER — Other Ambulatory Visit: Payer: Self-pay

## 2021-10-23 DIAGNOSIS — Z01812 Encounter for preprocedural laboratory examination: Secondary | ICD-10-CM | POA: Diagnosis present

## 2021-10-23 DIAGNOSIS — Z20822 Contact with and (suspected) exposure to covid-19: Secondary | ICD-10-CM | POA: Diagnosis not present

## 2021-10-23 LAB — RPR: RPR Ser Ql: NONREACTIVE

## 2021-10-23 LAB — TYPE AND SCREEN
ABO/RH(D): O POS
Antibody Screen: NEGATIVE

## 2021-10-23 LAB — CBC
HCT: 38.6 % (ref 36.0–46.0)
Hemoglobin: 12.8 g/dL (ref 12.0–15.0)
MCH: 25.9 pg — ABNORMAL LOW (ref 26.0–34.0)
MCHC: 33.2 g/dL (ref 30.0–36.0)
MCV: 78.1 fL — ABNORMAL LOW (ref 80.0–100.0)
Platelets: 266 10*3/uL (ref 150–400)
RBC: 4.94 MIL/uL (ref 3.87–5.11)
RDW: 15.8 % — ABNORMAL HIGH (ref 11.5–15.5)
WBC: 7.6 10*3/uL (ref 4.0–10.5)
nRBC: 0 % (ref 0.0–0.2)

## 2021-10-23 LAB — BASIC METABOLIC PANEL
Anion gap: 9 (ref 5–15)
BUN: 6 mg/dL (ref 6–20)
CO2: 23 mmol/L (ref 22–32)
Calcium: 8.8 mg/dL — ABNORMAL LOW (ref 8.9–10.3)
Chloride: 102 mmol/L (ref 98–111)
Creatinine, Ser: 0.9 mg/dL (ref 0.44–1.00)
GFR, Estimated: >60 mL/min (ref >60)
Glucose, Bld: 122 mg/dL — ABNORMAL HIGH (ref 70–99)
Potassium: 4.2 mmol/L (ref 3.5–5.1)
Sodium: 134 mmol/L — ABNORMAL LOW (ref 135–145)

## 2021-10-23 LAB — SARS CORONAVIRUS 2 (TAT 6-24 HRS): SARS Coronavirus 2: NEGATIVE

## 2021-10-24 NOTE — H&P (Addendum)
Isabella Silva is a 31 y.o. female presenting for a repeat Cesarean section.  She is a G2P1001 at 39 weeks 1 day EGA based on LMP 01/24/21.  EDC is 10/31/2021 and was confirmed by a first trimester ultrasound.  Prenatal course significant for: Breech presentation.  Single umbilical artery, sp serial growth ultrasounds and BPP from 32 weeks onwards.  Last growth U/S: 2/8: EFW 6lb 12 oz (58%). Breech.  Sickle cell trait.  Elevated 1 hr GCT, normal 3 hr GTT.  GBS positive. History of HSV and on Valacyclovir from [redacted] weeks EGA.   OB History     Gravida  2   Para  1   Term  1   Preterm      AB      Living  1      SAB      IAB      Ectopic      Multiple      Live Births  1          Past Medical History:  Diagnosis Date   HSV infection   Anxiety.   Past Surgical History:  Procedure Laterality Date   CESAREAN SECTION N/A 05/25/2014   Procedure: PRIMARY CESAREAN SECTION;  Surgeon: Alinda Dooms, MD;  Location: [facility];  Service: Obstetrics;  Laterality: N/A;   WISDOM TOOTH EXTRACTION     Family History: family history includes Cancer in her paternal grandmother; Cancer (age of onset: 70) in her paternal aunt; Diabetes in her mother. Social History:  reports that she has never smoked. She has never used smokeless tobacco. She reports that she does not drink alcohol and does not use drugs.     Maternal Diabetes: No Genetic Screening: Normal Maternal Ultrasounds/Referrals: Other: Single umbilical artery.  Fetal Ultrasounds or other Referrals:  None Maternal Substance Abuse:  No Significant Maternal Medications:  Meds include: Other: Valacyclovir Significant Maternal Lab Results:  Group B Strep positive Other Comments:  None  Review of Systems Constitutional: Denies fevers/chills Cardiovascular: Denies chest pain or palpitations Pulmonary: Denies coughing or wheezing Gastrointestinal: Denies nausea, vomiting or diarrhea Genitourinary: Denies pelvic pain,  unusual vaginal bleeding, unusual vaginal discharge, dysuria, urgency or frequency.  Musculoskeletal: Denies muscle or joint aches and pain.  Neurology: Denies abnormal sensations such as tingling or numbness.     Last menstrual period 01/24/2021, unknown if currently breastfeeding. Exam Physical Exam  Pulse 82, temperature 98.3 F (36.8 C), temperature source Oral, height 5\' 5"  (1.651 m), weight 95.3 kg, last menstrual period 01/24/2021, unknown if currently breastfeeding.  Constitutional: She is oriented to person, place, and time. She appears well-developed and well-nourished.  HENT:  Head: Normocephalic and atraumatic.  Neck: Normal range of motion.  Cardiovascular: Normal rate, regular rhythm and normal heart sounds.   Respiratory: Effort normal and breath sounds normal.  GI: Soft. Bowel sounds are normal.  Abdomen: Gravid,appropriate for gestation age.  Neurological: She is alert and oriented to person, place, and time.  Skin: Skin is warm and dry.  Psychiatric: She has a normal mood and affect. Her behavior is normal.   Prenatal labs: ABO, Rh: --/--/O POS (02/22 1138) Antibody: NEG (02/22 1138) Rubella: Immune (07/26 0000) RPR: NON REACTIVE (02/22 1138)  HBsAg: Negative (07/26 0000)  HIV: Non-reactive (07/26 0000)  GBS:   Positive  Recent Results (from the past 2160 hour(s))  CBC with Differential     Status: Abnormal   Collection Time: 10/15/21  9:40 AM  Result Value Ref Range  WBC 8.1 4.0 - 10.5 K/uL   RBC 5.38 (H) 3.87 - 5.11 MIL/uL   Hemoglobin 13.8 12.0 - 15.0 g/dL   HCT 42.9 36.0 - 46.0 %   MCV 79.7 (L) 80.0 - 100.0 fL   MCH 25.7 (L) 26.0 - 34.0 pg   MCHC 32.2 30.0 - 36.0 g/dL   RDW 15.4 11.5 - 15.5 %   Platelets 259 150 - 400 K/uL   nRBC 0.0 0.0 - 0.2 %   Neutrophils Relative % 69 %   Neutro Abs 5.6 1.7 - 7.7 K/uL   Lymphocytes Relative 21 %   Lymphs Abs 1.7 0.7 - 4.0 K/uL   Monocytes Relative 8 %   Monocytes Absolute 0.7 0.1 - 1.0 K/uL   Eosinophils  Relative 1 %   Eosinophils Absolute 0.1 0.0 - 0.5 K/uL   Basophils Relative 0 %   Basophils Absolute 0.0 0.0 - 0.1 K/uL   Immature Granulocytes 1 %   Abs Immature Granulocytes 0.09 (H) 0.00 - 0.07 K/uL    Comment: Performed at [facility] [address] [place], [place] [address]  Comprehensive metabolic panel     Status: Abnormal   Collection Time: 10/15/21  9:40 AM  Result Value Ref Range   Sodium 135 135 - 145 mmol/L   Potassium 3.9 3.5 - 5.1 mmol/L   Chloride 104 98 - 111 mmol/L   CO2 21 (L) 22 - 32 mmol/L   Glucose, Bld 91 70 - 99 mg/dL    Comment: Glucose reference range applies only to samples taken after fasting for at least 8 hours.   BUN 6 6 - 20 mg/dL   Creatinine, Ser 0.86 0.44 - 1.00 mg/dL   Calcium 9.7 8.9 - 10.3 mg/dL   Total Protein 7.2 6.5 - 8.1 g/dL   Albumin 3.1 (L) 3.5 - 5.0 g/dL   AST 25 15 - 41 U/L   ALT 24 0 - 44 U/L   Alkaline Phosphatase 131 (H) 38 - 126 U/L   Total Bilirubin 0.3 0.3 - 1.2 mg/dL   GFR, Estimated >60 >60 mL/min    Comment: (NOTE) Calculated using the CKD-EPI Creatinine Equation (2021)    Anion gap 10 5 - 15    Comment: Performed at [facility], [place] [address], [place], [place] [address]  Type and screen [place]     Status: None   Collection Time: 10/15/21  9:40 AM  Result Value Ref Range   ABO/RH(D) O POS    Antibody Screen NEG    Sample Expiration      10/18/2021,2359 Performed at [facility], [place] [address], [place], [place] [address]   Urinalysis, Routine w reflex microscopic     Status: None   Collection Time: 10/15/21 11:37 AM  Result Value Ref Range   Color, Urine YELLOW YELLOW   APPearance CLEAR CLEAR   Specific Gravity, Urine 1.011 1.005 - 1.030   pH 6.0 5.0 - 8.0   Glucose, UA NEGATIVE NEGATIVE mg/dL   Hgb urine dipstick NEGATIVE NEGATIVE   Bilirubin Urine NEGATIVE NEGATIVE   Ketones, ur NEGATIVE NEGATIVE mg/dL   Protein, ur NEGATIVE NEGATIVE mg/dL   Nitrite NEGATIVE  NEGATIVE   Leukocytes,Ua NEGATIVE NEGATIVE    Comment: Performed at [facility] [address], [place],  [address]  Protein / creatinine ratio, urine     Status: Abnormal   Collection Time: 10/15/21 11:37 AM  Result Value Ref Range  Creatinine, Urine 75.33 mg/dL   Total Protein, Urine 16 mg/dL    Comment: NO NORMAL RANGE ESTABLISHED FOR THIS TEST   Protein Creatinine Ratio 0.21 (H) 0.00 - 0.15 mg/mg[Cre]    Comment: Performed at [facility] [address] [place], [place] [address]  SARS Coronavirus 2 (TAT 6-24 hrs)     Status: None   Collection Time: 10/23/21 12:00 AM  Result Value Ref Range   SARS Coronavirus 2 RESULT: NEGATIVE     Comment: RESULT: NEGATIVESARS-CoV-2 INTERPRETATION:A NEGATIVE  test result means that SARS-CoV-2 RNA was not present in the specimen above the limit of detection of this test. This does not preclude a possible SARS-CoV-2 infection and should not be used as the  sole basis for patient management decisions. Negative results must be combined with clinical observations, patient history, and epidemiological information. Optimum specimen types and timing for peak viral levels during infections caused by SARS-CoV-2  have not been determined. Collection of multiple specimens or types of specimens may be necessary to detect virus. Improper specimen collection and handling, sequence variability under primers/probes, or organism present below the limit of detection may  lead to false negative results. Positive and negative predictive values of testing are highly dependent on prevalence. False negative test results are more likely when prevalence of disease is high.The expected result is NEGATIVE.Fact S heet for  Healthcare Providers: LocalChronicle.no Sheet for Patients: SalonLookup.es Reference Range - Negative   RPR     Status: None   Collection Time: 10/23/21 11:38 AM  Result Value Ref  Range   RPR Ser Ql NON REACTIVE NON REACTIVE    Comment: Performed at [facility], [place] [address] [place], [place] [address]  Type and screen     Status: None   Collection Time: 10/23/21 11:38 AM  Result Value Ref Range   ABO/RH(D) O POS    Antibody Screen NEG    Sample Expiration      10/26/2021,2359 Performed at [facility], [place] [address] [place], [place] [address]   CBC     Status: Abnormal   Collection Time: 10/23/21 11:38 AM  Result Value Ref Range   WBC 7.6 4.0 - 10.5 K/uL   RBC 4.94 3.87 - 5.11 MIL/uL   Hemoglobin 12.8 12.0 - 15.0 g/dL   HCT 38.6 36.0 - 46.0 %   MCV 78.1 (L) 80.0 - 100.0 fL   MCH 25.9 (L) 26.0 - 34.0 pg   MCHC 33.2 30.0 - 36.0 g/dL   RDW 15.8 (H) 11.5 - 15.5 %   Platelets 266 150 - 400 K/uL   nRBC 0.0 0.0 - 0.2 %    Comment: Performed at [facility], [place] [address] [place], [place] [id number]  Basic metabolic panel     Status: Abnormal   Collection Time: 10/23/21 11:38 AM  Result Value Ref Range   Sodium 134 (L) 135 - 145 mmol/L   Potassium 4.2 3.5 - 5.1 mmol/L   Chloride 102 98 - 111 mmol/L   CO2 23 22 - 32 mmol/L   Glucose, Bld 122 (H) 70 - 99 mg/dL    Comment: Glucose reference range applies only to samples taken after fasting for at least 8 hours.   BUN 6 6 - 20 mg/dL   Creatinine, Ser 0.90 0.44 - 1.00 mg/dL   Calcium 8.8 (L) 8.9 - 10.3 mg/dL   GFR, Estimated >60 >60 mL/min    Comment: (NOTE) Calculated using the CKD-EPI  Creatinine Equation (2021)    Anion gap 9 5 - 15    Comment: Performed at [facility], [place] [address] [place], [place] [address]      Assessment/Plan: 31 y/o G2P1001 at 11 W 1 D EGA here for a repeat C/section.   - This procedure has been fully reviewed with the patient and written informed consent has been obtained.   -Admit to Pre-op at [facility] - NPO and IV fluids - LR at 125 cc/hr.  Archie Endo, MD.  10/24/2021, 10:55 PM

## 2021-10-25 ENCOUNTER — Inpatient Hospital Stay (HOSPITAL_COMMUNITY)
Admission: RE | Admit: 2021-10-25 | Discharge: 2021-10-27 | DRG: 787 | Disposition: A | Discharge status: Home / Self Care | Payer: Medicaid Other | Attending: Obstetrics & Gynecology | Admitting: Obstetrics & Gynecology

## 2021-10-25 ENCOUNTER — Other Ambulatory Visit: Payer: Self-pay

## 2021-10-25 ENCOUNTER — Inpatient Hospital Stay (HOSPITAL_COMMUNITY): Payer: Medicaid Other | Admitting: Certified Registered Nurse Anesthetist

## 2021-10-25 ENCOUNTER — Encounter (HOSPITAL_COMMUNITY)
Admission: RE | Disposition: A | Discharge status: Home / Self Care | Payer: Self-pay | Source: Home / Self Care | Attending: Obstetrics & Gynecology

## 2021-10-25 ENCOUNTER — Encounter (HOSPITAL_COMMUNITY): Payer: Self-pay | Admitting: Obstetrics & Gynecology

## 2021-10-25 DIAGNOSIS — O134 Gestational [pregnancy-induced] hypertension without significant proteinuria, complicating childbirth: Secondary | ICD-10-CM | POA: Diagnosis present

## 2021-10-25 DIAGNOSIS — Z20822 Contact with and (suspected) exposure to covid-19: Secondary | ICD-10-CM | POA: Diagnosis present

## 2021-10-25 DIAGNOSIS — Z3A39 39 weeks gestation of pregnancy: Secondary | ICD-10-CM

## 2021-10-25 DIAGNOSIS — O34211 Maternal care for low transverse scar from previous cesarean delivery: Secondary | ICD-10-CM | POA: Diagnosis present

## 2021-10-25 DIAGNOSIS — O99824 Streptococcus B carrier state complicating childbirth: Secondary | ICD-10-CM | POA: Diagnosis present

## 2021-10-25 DIAGNOSIS — O321XX0 Maternal care for breech presentation, not applicable or unspecified: Secondary | ICD-10-CM

## 2021-10-25 DIAGNOSIS — O9832 Other infections with a predominantly sexual mode of transmission complicating childbirth: Secondary | ICD-10-CM | POA: Diagnosis present

## 2021-10-25 DIAGNOSIS — D573 Sickle-cell trait: Secondary | ICD-10-CM | POA: Diagnosis present

## 2021-10-25 DIAGNOSIS — O139 Gestational [pregnancy-induced] hypertension without significant proteinuria, unspecified trimester: Secondary | ICD-10-CM | POA: Diagnosis not present

## 2021-10-25 DIAGNOSIS — A6000 Herpesviral infection of urogenital system, unspecified: Secondary | ICD-10-CM | POA: Diagnosis present

## 2021-10-25 DIAGNOSIS — O9902 Anemia complicating childbirth: Secondary | ICD-10-CM | POA: Diagnosis present

## 2021-10-25 SURGERY — Surgical Case
Anesthesia: Spinal

## 2021-10-25 MED ORDER — SCOPOLAMINE 1 MG/3DAYS TD PT72: 1.0000 | MEDICATED_PATCH | Freq: Once | TRANSDERMAL | Status: DC

## 2021-10-25 MED ORDER — HYDROMORPHONE HCL 1 MG/ML IJ SOLN: 0.2000 mg | INTRAMUSCULAR | Status: DC | PRN

## 2021-10-25 MED ORDER — ONDANSETRON HCL 4 MG/2ML IJ SOLN: 4.0000 mg | Freq: Three times a day (TID) | INTRAMUSCULAR | Status: DC | PRN

## 2021-10-25 MED ORDER — ACETAMINOPHEN 10 MG/ML IV SOLN: 1000.0000 mg | Freq: Once | INTRAVENOUS | Status: DC | PRN

## 2021-10-25 MED ORDER — PHENYLEPHRINE HCL-NACL 20-0.9 MG/250ML-% IV SOLN
INTRAVENOUS | Status: DC | PRN
  Administered 2021-10-25: 60 ug/min via INTRAVENOUS

## 2021-10-25 MED ORDER — PHENYLEPHRINE 40 MCG/ML (10ML) SYRINGE FOR IV PUSH (FOR BLOOD PRESSURE SUPPORT)
PREFILLED_SYRINGE | INTRAVENOUS | Status: AC
  Filled 2021-10-25: qty 10

## 2021-10-25 MED ORDER — OXYTOCIN-SODIUM CHLORIDE 30-0.9 UT/500ML-% IV SOLN
INTRAVENOUS | Status: AC
Start: 2021-10-25 — End: ?
  Filled 2021-10-25: qty 500

## 2021-10-25 MED ORDER — SIMETHICONE 80 MG PO CHEW
80.0000 mg | CHEWABLE_TABLET | ORAL | Status: DC | PRN
  Administered 2021-10-26 – 2021-10-27 (×4): 80 mg via ORAL
  Filled 2021-10-25 (×4): qty 1

## 2021-10-25 MED ORDER — STERILE WATER FOR IRRIGATION IR SOLN
Status: DC | PRN
  Administered 2021-10-25: 1000 mL

## 2021-10-25 MED ORDER — WITCH HAZEL-GLYCERIN EX PADS: 1.0000 "application " | MEDICATED_PAD | CUTANEOUS | Status: DC | PRN

## 2021-10-25 MED ORDER — POVIDONE-IODINE 10 % EX SWAB: 2.0000 "application " | Freq: Once | CUTANEOUS | Status: DC

## 2021-10-25 MED ORDER — ONDANSETRON HCL 4 MG/2ML IJ SOLN
INTRAMUSCULAR | Status: AC
Start: 2021-10-25 — End: ?
  Filled 2021-10-25: qty 2

## 2021-10-25 MED ORDER — DIPHENHYDRAMINE HCL 25 MG PO CAPS
25.0000 mg | ORAL_CAPSULE | ORAL | Status: DC | PRN
  Administered 2021-10-25: 25 mg via ORAL
  Filled 2021-10-25: qty 1

## 2021-10-25 MED ORDER — COCONUT OIL OIL: 1.0000 "application " | TOPICAL_OIL | Status: DC | PRN

## 2021-10-25 MED ORDER — FENTANYL CITRATE (PF) 100 MCG/2ML IJ SOLN
INTRAMUSCULAR | Status: DC | PRN
  Administered 2021-10-25: 15 ug via INTRATHECAL

## 2021-10-25 MED ORDER — MORPHINE SULFATE (PF) 0.5 MG/ML IJ SOLN
INTRAMUSCULAR | Status: AC
  Filled 2021-10-25: qty 10

## 2021-10-25 MED ORDER — BUPIVACAINE IN DEXTROSE 0.75-8.25 % IT SOLN
INTRATHECAL | Status: DC | PRN
  Administered 2021-10-25: 1.6 mL via INTRATHECAL

## 2021-10-25 MED ORDER — CEFAZOLIN SODIUM-DEXTROSE 2-4 GM/100ML-% IV SOLN
2.0000 g | INTRAVENOUS | Status: AC
  Administered 2021-10-25: 2 g via INTRAVENOUS

## 2021-10-25 MED ORDER — ONDANSETRON HCL 4 MG/2ML IJ SOLN
INTRAMUSCULAR | Status: DC | PRN
  Administered 2021-10-25: 4 mg via INTRAVENOUS

## 2021-10-25 MED ORDER — FENTANYL CITRATE (PF) 100 MCG/2ML IJ SOLN: 25.0000 ug | INTRAMUSCULAR | Status: DC | PRN

## 2021-10-25 MED ORDER — DIBUCAINE (PERIANAL) 1 % EX OINT: 1.0000 "application " | TOPICAL_OINTMENT | CUTANEOUS | Status: DC | PRN

## 2021-10-25 MED ORDER — MENTHOL 3 MG MT LOZG: 1.0000 | LOZENGE | OROMUCOSAL | Status: DC | PRN

## 2021-10-25 MED ORDER — LACTATED RINGERS IV SOLN
INTRAVENOUS | Status: DC
  Administered 2021-10-25: 125 mL/h via INTRAVENOUS

## 2021-10-25 MED ORDER — SODIUM CHLORIDE 0.9 % IR SOLN
Status: DC | PRN
  Administered 2021-10-25: 1

## 2021-10-25 MED ORDER — ZOLPIDEM TARTRATE 5 MG PO TABS: 5.0000 mg | ORAL_TABLET | Freq: Every evening | ORAL | Status: DC | PRN

## 2021-10-25 MED ORDER — PHENYLEPHRINE HCL-NACL 20-0.9 MG/250ML-% IV SOLN
INTRAVENOUS | Status: AC
Start: 2021-10-25 — End: ?
  Filled 2021-10-25: qty 250

## 2021-10-25 MED ORDER — MORPHINE SULFATE (PF) 0.5 MG/ML IJ SOLN
INTRAMUSCULAR | Status: DC | PRN
  Administered 2021-10-25: 150 ug via INTRATHECAL

## 2021-10-25 MED ORDER — FENTANYL CITRATE (PF) 100 MCG/2ML IJ SOLN
INTRAMUSCULAR | Status: AC
  Filled 2021-10-25: qty 2

## 2021-10-25 MED ORDER — SENNOSIDES-DOCUSATE SODIUM 8.6-50 MG PO TABS
2.0000 | ORAL_TABLET | Freq: Every day | ORAL | Status: DC
  Administered 2021-10-26 – 2021-10-27 (×2): 2 via ORAL
  Filled 2021-10-25 (×2): qty 2

## 2021-10-25 MED ORDER — NALOXONE HCL 4 MG/10ML IJ SOLN
1.0000 ug/kg/h | INTRAVENOUS | Status: DC | PRN
  Filled 2021-10-25: qty 5

## 2021-10-25 MED ORDER — KETOROLAC TROMETHAMINE 30 MG/ML IJ SOLN: 30.0000 mg | Freq: Four times a day (QID) | INTRAMUSCULAR | Status: AC | PRN

## 2021-10-25 MED ORDER — OXYTOCIN-SODIUM CHLORIDE 30-0.9 UT/500ML-% IV SOLN: 2.5000 [IU]/h | INTRAVENOUS | Status: AC

## 2021-10-25 MED ORDER — CEFAZOLIN SODIUM-DEXTROSE 2-4 GM/100ML-% IV SOLN
INTRAVENOUS | Status: AC
  Filled 2021-10-25: qty 100

## 2021-10-25 MED ORDER — OXYTOCIN-SODIUM CHLORIDE 30-0.9 UT/500ML-% IV SOLN
INTRAVENOUS | Status: AC
  Filled 2021-10-25: qty 500

## 2021-10-25 MED ORDER — DIPHENHYDRAMINE HCL 25 MG PO CAPS
25.0000 mg | ORAL_CAPSULE | Freq: Four times a day (QID) | ORAL | Status: DC | PRN
  Administered 2021-10-26 (×2): 25 mg via ORAL
  Filled 2021-10-25 (×2): qty 1

## 2021-10-25 MED ORDER — OXYTOCIN-SODIUM CHLORIDE 30-0.9 UT/500ML-% IV SOLN
INTRAVENOUS | Status: DC | PRN
  Administered 2021-10-25 (×2): 30 [IU] via INTRAVENOUS

## 2021-10-25 MED ORDER — DEXAMETHASONE SODIUM PHOSPHATE 10 MG/ML IJ SOLN
INTRAMUSCULAR | Status: AC
  Filled 2021-10-25: qty 1

## 2021-10-25 MED ORDER — KETOROLAC TROMETHAMINE 30 MG/ML IJ SOLN
30.0000 mg | Freq: Four times a day (QID) | INTRAMUSCULAR | Status: AC | PRN
  Administered 2021-10-25: 30 mg via INTRAVENOUS

## 2021-10-25 MED ORDER — OXYCODONE HCL 5 MG PO TABS
5.0000 mg | ORAL_TABLET | ORAL | Status: DC | PRN
  Administered 2021-10-26: 10 mg via ORAL
  Administered 2021-10-26: 5 mg via ORAL
  Administered 2021-10-27 (×2): 10 mg via ORAL
  Filled 2021-10-25 (×2): qty 2
  Filled 2021-10-25: qty 1
  Filled 2021-10-25: qty 2

## 2021-10-25 MED ORDER — ACETAMINOPHEN 500 MG PO TABS
1000.0000 mg | ORAL_TABLET | Freq: Four times a day (QID) | ORAL | Status: DC
  Administered 2021-10-25 – 2021-10-27 (×6): 1000 mg via ORAL
  Filled 2021-10-25 (×6): qty 2

## 2021-10-25 MED ORDER — DIPHENHYDRAMINE HCL 50 MG/ML IJ SOLN
INTRAMUSCULAR | Status: AC
  Filled 2021-10-25: qty 1

## 2021-10-25 MED ORDER — KETOROLAC TROMETHAMINE 30 MG/ML IJ SOLN
30.0000 mg | Freq: Four times a day (QID) | INTRAMUSCULAR | Status: AC
  Administered 2021-10-25 – 2021-10-26 (×3): 30 mg via INTRAVENOUS
  Filled 2021-10-25 (×3): qty 1

## 2021-10-25 MED ORDER — ACETAMINOPHEN 500 MG PO TABS: 1000.0000 mg | ORAL_TABLET | Freq: Four times a day (QID) | ORAL | Status: DC

## 2021-10-25 MED ORDER — IBUPROFEN 600 MG PO TABS
600.0000 mg | ORAL_TABLET | Freq: Four times a day (QID) | ORAL | Status: DC
  Administered 2021-10-26 – 2021-10-27 (×4): 600 mg via ORAL
  Filled 2021-10-25 (×4): qty 1

## 2021-10-25 MED ORDER — NALOXONE HCL 0.4 MG/ML IJ SOLN: 0.4000 mg | INTRAMUSCULAR | Status: DC | PRN

## 2021-10-25 MED ORDER — PRENATAL MULTIVITAMIN CH
1.0000 | ORAL_TABLET | Freq: Every day | ORAL | Status: DC
  Administered 2021-10-26 – 2021-10-27 (×2): 1 via ORAL
  Filled 2021-10-25 (×2): qty 1

## 2021-10-25 MED ORDER — DIPHENHYDRAMINE HCL 50 MG/ML IJ SOLN
12.5000 mg | INTRAMUSCULAR | Status: DC | PRN
  Administered 2021-10-25: 12.5 mg via INTRAVENOUS
  Filled 2021-10-25: qty 1

## 2021-10-25 MED ORDER — DEXAMETHASONE SODIUM PHOSPHATE 10 MG/ML IJ SOLN
INTRAMUSCULAR | Status: DC | PRN
  Administered 2021-10-25: 10 mg via INTRAVENOUS

## 2021-10-25 MED ORDER — KETOROLAC TROMETHAMINE 30 MG/ML IJ SOLN
INTRAMUSCULAR | Status: AC
  Filled 2021-10-25: qty 1

## 2021-10-25 MED ORDER — SODIUM CHLORIDE 0.9% FLUSH: 3.0000 mL | INTRAVENOUS | Status: DC | PRN

## 2021-10-25 SURGICAL SUPPLY — 39 items
CHLORAPREP W/TINT 26ML (MISCELLANEOUS) ×2 IMPLANT
CLAMP CORD UMBIL (MISCELLANEOUS) IMPLANT
CLOTH BEACON ORANGE TIMEOUT ST (SAFETY) ×2 IMPLANT
DERMABOND ADHESIVE PROPEN (GAUZE/BANDAGES/DRESSINGS) ×1
DERMABOND ADVANCED (GAUZE/BANDAGES/DRESSINGS)
DERMABOND ADVANCED .7 DNX12 (GAUZE/BANDAGES/DRESSINGS) IMPLANT
DERMABOND ADVANCED .7 DNX6 (GAUZE/BANDAGES/DRESSINGS) IMPLANT
DRAPE C SECTION CLR SCREEN (DRAPES) ×2 IMPLANT
DRSG OPSITE POSTOP 4X10 (GAUZE/BANDAGES/DRESSINGS) ×2 IMPLANT
ELECT REM PT RETURN 9FT ADLT (ELECTROSURGICAL) ×2
ELECTRODE REM PT RTRN 9FT ADLT (ELECTROSURGICAL) ×1 IMPLANT
EXTRACTOR VACUUM M CUP 4 TUBE (SUCTIONS) IMPLANT
GLOVE BIOGEL PI IND STRL 7.0 (GLOVE) ×2 IMPLANT
GLOVE BIOGEL PI INDICATOR 7.0 (GLOVE) ×2
GLOVE SURG SS PI 6.5 STRL IVOR (GLOVE) ×2 IMPLANT
GOWN STRL REUS W/TWL LRG LVL3 (GOWN DISPOSABLE) ×4 IMPLANT
KIT ABG SYR 3ML LUER SLIP (SYRINGE) IMPLANT
NDL HYPO 25X5/8 SAFETYGLIDE (NEEDLE) IMPLANT
NEEDLE HYPO 25X5/8 SAFETYGLIDE (NEEDLE) IMPLANT
NS IRRIG 1000ML POUR BTL (IV SOLUTION) ×2 IMPLANT
PACK C SECTION WH (CUSTOM PROCEDURE TRAY) ×2 IMPLANT
PAD OB MATERNITY 4.3X12.25 (PERSONAL CARE ITEMS) ×2 IMPLANT
PENCIL SMOKE EVAC W/HOLSTER (ELECTROSURGICAL) ×2 IMPLANT
RETAINER VISCERAL (MISCELLANEOUS) ×1 IMPLANT
RTRCTR C-SECT PINK 25CM LRG (MISCELLANEOUS) ×2 IMPLANT
SUT CHROMIC 1 CTX 36 (SUTURE) IMPLANT
SUT CHROMIC 2 0 CT 1 (SUTURE) ×2 IMPLANT
SUT MON AB 4-0 PS1 27 (SUTURE) ×2 IMPLANT
SUT PLAIN 1 NONE 54 (SUTURE) IMPLANT
SUT PLAIN 2 0 (SUTURE) ×1
SUT PLAIN 2 0 XLH (SUTURE) IMPLANT
SUT PLAIN ABS 2-0 CT1 27XMFL (SUTURE) IMPLANT
SUT VIC AB 0 CTX 36 (SUTURE) ×1
SUT VIC AB 0 CTX36XBRD ANBCTRL (SUTURE) ×1 IMPLANT
SUT VIC AB 1 CTX 36 (SUTURE) ×2
SUT VIC AB 1 CTX36XBRD ANBCTRL (SUTURE) ×2 IMPLANT
TOWEL OR 17X24 6PK STRL BLUE (TOWEL DISPOSABLE) ×2 IMPLANT
TRAY FOLEY W/BAG SLVR 14FR LF (SET/KITS/TRAYS/PACK) ×2 IMPLANT
WATER STERILE IRR 1000ML POUR (IV SOLUTION) ×2 IMPLANT

## 2021-10-25 NOTE — Interval H&P Note (Signed)
History and Physical Interval Note:  10/25/2021 3:02 PM  Isabella Silva  has presented today for surgery, with the diagnosis of POSTPROCEDURAL STATES.  The various methods of treatment have been discussed with the patient and family. After consideration of risks, benefits and other options for treatment, the patient has consented to  REPEAT CESAREAN SECTION (N/A)  as a surgical intervention.  The patient's history has been reviewed, patient examined, no change in status, stable for surgery.  I have reviewed the patient's chart and labs.  Questions were answered to the patient's satisfaction.    Cesiah Westley W Asad Keeven,MD.

## 2021-10-25 NOTE — Anesthesia Preprocedure Evaluation (Signed)
Anesthesia Evaluation  Patient identified by MRN, date of birth, ID band Patient awake    Reviewed: Allergy & Precautions, NPO status , Patient's Chart, lab work & pertinent test results  Airway Mallampati: II  TM Distance: >3 FB Neck ROM: Full    Dental no notable dental hx.    Pulmonary neg pulmonary ROS,    Pulmonary exam normal breath sounds clear to auscultation       Cardiovascular negative cardio ROS Normal cardiovascular exam Rhythm:Regular Rate:Normal     Neuro/Psych PSYCHIATRIC DISORDERS Anxiety Depression negative neurological ROS     GI/Hepatic negative GI ROS, Neg liver ROS,   Endo/Other  negative endocrine ROS  Renal/GU negative Renal ROS  negative genitourinary   Musculoskeletal negative musculoskeletal ROS (+)   Abdominal   Peds  Hematology  (+) Blood dyscrasia, Sickle cell trait ,   Anesthesia Other Findings Repeat C/S, breech presentation  Reproductive/Obstetrics (+) Pregnancy                             Anesthesia Physical Anesthesia Plan  ASA: 2  Anesthesia Plan: Spinal   Post-op Pain Management:    Induction:   PONV Risk Score and Plan: Treatment may vary due to age or medical condition  Airway Management Planned: Natural Airway  Additional Equipment:   Intra-op Plan:   Post-operative Plan:   Informed Consent: I have reviewed the patients History and Physical, chart, labs and discussed the procedure including the risks, benefits and alternatives for the proposed anesthesia with the patient or authorized representative who has indicated his/her understanding and acceptance.     Dental advisory given  Plan Discussed with: CRNA  Anesthesia Plan Comments:         Anesthesia Quick Evaluation

## 2021-10-25 NOTE — Anesthesia Postprocedure Evaluation (Signed)
Anesthesia Post Note  Patient: Isabella Silva  Procedure(s) Performed: [place]     Patient location during evaluation: PACU Anesthesia Type: Spinal Level of consciousness: oriented and awake and alert Pain management: pain level controlled Vital Signs Assessment: post-procedure vital signs reviewed and stable Respiratory status: spontaneous breathing, respiratory function stable and nonlabored ventilation Cardiovascular status: blood pressure returned to baseline and stable Postop Assessment: no headache, no backache, no apparent nausea or vomiting, patient able to bend at knees and spinal receding Anesthetic complications: no   No notable events documented.  Last Vitals:  Vitals:   10/25/21 1745 10/25/21 1800  BP: 116/72 138/90  Pulse: 74   Resp: 14 14  Temp:    SpO2: 99% 99%    Last Pain:  Vitals:   10/25/21 1745  TempSrc:   PainSc: 4    Pain Goal:                   Gurjot Brisco A.

## 2021-10-25 NOTE — Lactation Note (Signed)
This note was copied from a baby's chart. Lactation Consultation Note   Patient Name: Boy Aletheia Tangredi JMEQA'S Date: 10/25/2021 Reason for consult: Initial assessment;Term;1st time breastfeeding Age:33 hours   P2 Mom who recently formula fed infant .  Per support person baby was bottle fed because he was very congested and would not latch.  [employer] [employer] student was unable to witness a latch at this time.  Basic breastfeeding information reviewed with mom.  Mom was taught to hand express and drops of colostrum was noted.  Per mom she did not breastfeed her other child and wishes to breastfeed this child. Outpatient resources given to mom and all questions answered prior to leaving.  Mom knows to call LC/RN for assistance.     Feeding plan: 1- feed infant 8-12+ times in 24 hours based on hunger cues 2- continue skin to skin 3- hand express and offer colostrum prior to formula supplementation  4- Supplement infant as needed based on age appropriate volumes.      Maternal Data Has patient been taught Hand Expression?: Yes Does the patient have breastfeeding experience prior to this delivery?: No   Feeding Mother's Current Feeding Choice: Breast Milk and Formula     Interventions Interventions: Breast feeding basics reviewed;Education;Skin to skin;LC Services brochure;Hand express   Discharge Pump: Personal   Consult Status Consult Status: Follow-up Date: 10/26/21 Follow-up type: In-patient       [place] [facility] student 10/25/2021, 7:41 PM  I concur with Lactation Note written by [facility] student.   Culver Feighner A Higuera Ancidey 10/25/2021, 7:55 PM

## 2021-10-25 NOTE — Transfer of Care (Signed)
Immediate Anesthesia Transfer of Care Note  Patient: Isabella Silva  Procedure(s) Performed: CESAREAN SECTION  Patient Location: PACU  Anesthesia Type:Spinal  Level of Consciousness: awake, alert  and oriented  Airway & Oxygen Therapy: Patient Spontanous Breathing  Post-op Assessment: Report given to RN and Post -op Vital signs reviewed and stable  Post vital signs: Reviewed and stable  Last Vitals:  Vitals Value Taken Time  BP    Temp    Pulse 74 10/25/21 1714  Resp 16 10/25/21 1714  SpO2 99 % 10/25/21 1714  Vitals shown include unvalidated device data.  Last Pain:  Vitals:   10/25/21 1320  TempSrc: Oral         Complications: No notable events documented.

## 2021-10-25 NOTE — Anesthesia Procedure Notes (Signed)
Spinal  Patient location during procedure: OR Start time: 10/25/2021 3:17 PM End time: 10/25/2021 3:21 PM Reason for block: surgical anesthesia Staffing Performed: anesthesiologist  Anesthesiologist: Elmer Picker, MD Preanesthetic Checklist Completed: patient identified, IV checked, risks and benefits discussed, surgical consent, monitors and equipment checked, pre-op evaluation and timeout performed Spinal Block Patient position: sitting Prep: DuraPrep and site prepped and draped Patient monitoring: cardiac monitor, continuous pulse ox and blood pressure Approach: midline Location: L3-4 Injection technique: single-shot Needle Needle type: Pencan  Needle gauge: 24 G Needle length: 9 cm Assessment Sensory level: T6 Events: CSF return Additional Notes Functioning IV was confirmed and monitors were applied. Sterile prep and drape, including hand hygiene and sterile gloves were used. The patient was positioned and the spine was prepped. The skin was anesthetized with lidocaine.  Free flow of clear CSF was obtained prior to injecting local anesthetic into the CSF.  The spinal needle aspirated freely following injection.  The needle was carefully withdrawn.  The patient tolerated the procedure well.

## 2021-10-25 NOTE — Op Note (Addendum)
Patient: Isabella Silva DOB:  Jul 28, 1991 MRN: [id number]  DATE OF SURGERY: 10/25/2021  PREOP DIAGNOSIS:  1. 39 week 1 day EGA intrauterine pregnancy. 2. One prior cesarean section and desiring a repeat cesarean section: She declined trial of labor and vaginal birth after cesarean section.   3. Homero Fellers breech presentation.  POSTOP DIAGNOSIS: Same as above.  PROCEDURE: Repeat low uterine segment transverse cesarean section via Pfannenstiel incision.     SURGEON: Dr.  Hoover Browns  ASSISTANT: Margie Billet  ATTENDING ATTESTATION: I was present and scrubbed and performed the procedure and the assistant was required due to the complexity of anatomy.   ANESTHESIA: Spinal  COMPLICATIONS: None  FINDINGS: Viable female infant in frank breech presentation, LSA position.  Weight pending, Apgar scores of 9 and 9. Normal uterus, fallopian tubes and ovaries bilaterally.    EBL: 265 cc  IV FLUID:  2500 cc LR   URINE OUTPUT: 100 cc clear urine  INDICATIONS:  31 y/o P1 with a history 1 cesarean section for breech presentation and  now with frank breech presentation who presented  for a repeat Cesarean section.  She was consented for the procedure after explaining risks benefits and alternatives of the procedure including but not limited to risks of bleeding, infection and damage to organs, placenta problems in future pregnancies.       PROCEDURE:   Informed consent was obtained from the patient to undergo the procedure. She was taken to the operating room where her spinal anesthesia was found to be adequate. She was prepped and draped in the usual sterile fashion and a Foley catheter was placed. She received 2 g of IV Ancef preoperatively. A Pfannenstiel incision was made over the prior incision with the scalpel and the incision extended through the subcutaneous layer and also the fascia with the bovie. Small perforators in the subcutaneous layer were contained with the Bovie. The fascia was nicked in  the midline and then was further separated from the rectus muscles bilaterally using Mayo scissors. Kochers were placed inferiorly and then superiorly to allow further separation of fascia from the rectus muscles.  The peritoneal cavity was entered bluntly with the fingers and further opened with the scissors.  The Alexis retractor was placed in. The uterus was ballooned and separation of bladder peritoneal fold from the uterus could not be done.     The uterus was incised with a scalpel and the incision extended bluntly bilaterally with fingers.  Membranes were ruptured and copious clear amniotic fluid was noted. The motions to deliver the breech presenting fetus were performed atraumatically: The sacrum was brought to the incision and delivered. The feet and legs were then delivered.  Gentle traction was applied on the hips to deliver the trunk.  Baby was rotated to its left side and right arm was delivered.  Baby was then rotated to its right side and the left arm was delivered.  Maxillary pressure was applied and abdominal pressure applied to deliver the head.  She delivered a viable female infant, apgar scores 9, 9.  The cord was clamped and cut after 1 minute. Cord blood was collected.    The uterus was not exteriorized.  The edges of the incision was grasped with clamps. The placenta was delivered with gentle traction on the umbilical cord. The uterus was cleared of clots and debris with a lap.  At the posterior lower uterine segment there seemed to be a horizontal ridge in the myometrium.  The uterine incision was closed  with #1 Vicryl in a running locked stitch. An imbricating layer of the same stitch was placed over the initial closure.  A small area that bled on the right side was contained with figure of 8 stitch.  Irrigation was applied and suctioned out. Excellent hemostasis was noted over the incision.  The bowel was protected with the plastic fin protector. The muscles and peritoneum were then  reapproximated using chromic suture with interrupted stitches.  Fascia was closed using 0 Vicryl in a running stitch. The subcutaneous layer was irrigated and suctioned out. Small perforators were contained with the bovie.  The subcutaneous layer was closed using 1-0 plain in interrupted stitches. The skin was closed using 4-0 Monocryl. Dermabond was applied.  Honeycomb was then applied. The patient was then cleaned and she was taken to the recovery room with her baby in stable conditions.   SPECIMEN: Placenta to labor and delivery (noted to be intact and with 2 vessel umbilical cord), umbilical cord blood to lab.   DISPOSITION: TO PACU, STABLE.   Dr. Sallye Ober.   10/25/2021.  5:50 PM.

## 2021-10-26 LAB — COMPREHENSIVE METABOLIC PANEL
ALT: 18 U/L (ref 0–44)
AST: 26 U/L (ref 15–41)
Albumin: 2.4 g/dL — ABNORMAL LOW (ref 3.5–5.0)
Alkaline Phosphatase: 105 U/L (ref 38–126)
Anion gap: 9 (ref 5–15)
BUN: 9 mg/dL (ref 6–20)
CO2: 22 mmol/L (ref 22–32)
Calcium: 8.8 mg/dL — ABNORMAL LOW (ref 8.9–10.3)
Chloride: 104 mmol/L (ref 98–111)
Creatinine, Ser: 1 mg/dL (ref 0.44–1.00)
GFR, Estimated: >60 mL/min (ref >60)
Glucose, Bld: 143 mg/dL — ABNORMAL HIGH (ref 70–99)
Potassium: 4.1 mmol/L (ref 3.5–5.1)
Sodium: 135 mmol/L (ref 135–145)
Total Bilirubin: 0.2 mg/dL — ABNORMAL LOW (ref 0.3–1.2)
Total Protein: 5.9 g/dL — ABNORMAL LOW (ref 6.5–8.1)

## 2021-10-26 LAB — CBC
HCT: 37.5 % (ref 36.0–46.0)
Hemoglobin: 12.2 g/dL (ref 12.0–15.0)
MCH: 25.8 pg — ABNORMAL LOW (ref 26.0–34.0)
MCHC: 32.5 g/dL (ref 30.0–36.0)
MCV: 79.4 fL — ABNORMAL LOW (ref 80.0–100.0)
Platelets: 235 10*3/uL (ref 150–400)
RBC: 4.72 MIL/uL (ref 3.87–5.11)
RDW: 15.6 % — ABNORMAL HIGH (ref 11.5–15.5)
WBC: 12.3 10*3/uL — ABNORMAL HIGH (ref 4.0–10.5)
nRBC: 0 % (ref 0.0–0.2)

## 2021-10-26 LAB — PROTEIN / CREATININE RATIO, URINE
Creatinine, Urine: 65.01 mg/dL
Protein Creatinine Ratio: 0.25 mg/mg{Cre} — ABNORMAL HIGH (ref 0.00–0.15)
Total Protein, Urine: 16 mg/dL

## 2021-10-26 NOTE — Progress Notes (Signed)
MOB was referred for history of depression/anxiety. * Referral screened out by Clinical Social Worker because none of the following criteria appear to apply: ~ History of anxiety/depression during this pregnancy, or of post-partum depression. ~ Diagnosis of anxiety and/or depression within last 3 years. No concerns noted in MOB's OB record.  OR * MOB's symptoms currently being treated with medication and/or therapy.  MOB's Edinburgh Score is 0.  Please contact the Clinical Social Worker if needs arise, or if MOB requests.  Laurey Arrow, MSW, LCSW Clinical Social Work [phone]

## 2021-10-26 NOTE — Progress Notes (Addendum)
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 Pt was taken for RCS with Dr Sallye Ober on 2/24, EBL was , hgb drop of 12.8-12.2, had a viable baby female whom desires in pt circ.   Patient Active Problem List   Diagnosis Date Noted   Gestational hypertension 10/25/2021   Cesarean delivery delivered 10/25/2021   Papanicolaou smear of cervix with positive high risk human papilloma virus (HPV) test 01/04/2021   Hx of herpes genitalis 12/30/2019   Family history of breast cancer in first degree relative 12/30/2019   Anxiety 08/30/2014   Herpes simplex 08/30/2014   Postpartum depression 08/30/2014   Sickle cell trait (HCC) 08/30/2014   Status post primary low transverse cesarean section--breech 05/26/2014     Active Ambulatory Problems    Diagnosis Date Noted   Status post primary low transverse cesarean section--breech 05/26/2014   Hx of herpes genitalis 12/30/2019   Family history of breast cancer in first degree relative 12/30/2019   Anxiety 08/30/2014   Herpes simplex 08/30/2014   Postpartum depression 08/30/2014   Sickle cell trait (HCC) 08/30/2014   Papanicolaou smear of cervix with positive high risk human papilloma virus (HPV) test 01/04/2021   Resolved Ambulatory Problems    Diagnosis Date Noted   Breech presentation 05/26/2014   Past Medical History:  Diagnosis Date   HSV infection      Subjective: Patient up ad lib, denies syncope or dizziness. Reports consuming regular diet without issues and denies N/V. Patient reports 0 bowel movement + passing flatus.  Denies issues with urination and reports bleeding is "lighter."  Patient is breast and  bottle feeding and reports going well.  Desires undecided for postpartum contraception.  Pain is being appropriately managed with use of po meds. Denies HA, RUQ pain or vision changes. Mood stable. Declined depression or anxiety meds at this time.     Objective: Patient Vitals for the past 24 hrs:  BP Temp Temp src Pulse Resp SpO2 Height Weight  10/26/21 0603 113/66 97.8 F (36.6 C) Oral 81 16 -- -- --  10/26/21 0304 (!) 106/57 98.6 F (37 C) Oral 75 17 -- -- --  10/25/21 2333 (!) 110/59 98.2 F (36.8 C) Oral 83 18 -- -- --  10/25/21 1909 113/68 98.6 F (37 C) Oral 75 16 -- -- --  10/25/21 1822 (!) 152/63 98.7 F (37.1 C) Oral -- 15 99 % -- --  10/25/21 1800 138/90 98.3 F (36.8 C) Oral -- 14 99 % -- --  10/25/21 1745 116/72 -- -- 74 14 99 % -- --  10/25/21 1730 119/67 -- -- 73 18 100 % -- --  10/25/21 1715 (!) 119/102 -- -- 74 19 100 % -- --  10/25/21 1700 128/79 -- -- 72 18 98 % -- --  10/25/21 1650 125/86 98.1 F (36.7 C) Oral 72 18 100 % -- --  10/25/21 1320 -- 98.3 F (36.8 C) Oral 82 -- -- 5\' 5"  (1.651 m) 95.3 kg     Physical Exam:  General: alert, cooperative, and appears stated age Mood/Affect: happy Lungs: clear to auscultation, no wheezes, rales or rhonchi, symmetric air entry.  Heart: normal rate, regular rhythm,  normal S1, S2, no murmurs, rubs, clicks or gallops. Breast: breasts appear normal, no suspicious masses, no skin or nipple changes or axillary nodes. Abdomen:  + bowel sounds, soft, non-tender Incision: healing well, no significant drainage, no dehiscence, no significant erythema, Honeycomb dressing  Uterine Fundus: firm, involution -1 Lochia: appropriate Skin: Warm, Dry. DVT Evaluation: No evidence of DVT seen on physical exam. Negative Homan's sign. No cords or calf tenderness. No significant calf/ankle edema. No clonus   Labs: Recent Labs    10/23/21 1138 10/26/21 0429  HGB 12.8 12.2  HCT 38.6 37.5  WBC 7.6 12.3*    CBG (last 3)  No  results for input(s): GLUCAP in the last 72 hours.   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 Pt was taken for RCS with Dr Sallye Ober on 2/24, EBL was , hgb drop of 12.8-12.2, had a viable baby female whom desires in pt circ.   Pt stable. -1 Involution. Breast and bottle Feeding. Hemodynamically Stable.  Plan: Continue other mgmt as ordered Baby Female: In pt circ possibly today GHTN: Pending PCR, monitor BP, 1 week f/u for BP check H/O Anxiety and depression: no meds monitor mood, 1-2 week mood check PP VTE Prophylactics: SCD, ambulated as tolerates.  Pain control: Motrin/Tylenol/Narcotics PRN Education given regarding options for contraception, including barrier methods, injectable contraception, IUD placement, oral contraceptives.  Plan for discharge tomorrow, Breastfeeding, Lactation consult, and Circumcision prior to discharge  Dr. Sallye Ober to be updated on patient status  [facility] NP-C, CNM 10/26/2021, 7:43 AM MD Addendum:   I saw and examined patient at bedside and agree with above findings, assessment and plan as outlined above by [facility] NP-C, CNM.  Dr. Hoover Browns. 10/26/2021.  3:17 PM.

## 2021-10-26 NOTE — Lactation Note (Signed)
This note was copied from a baby's chart. Lactation Consultation Note  Patient Name: Isabella Silva BTYOM'A Date: 10/26/2021 Reason for consult: Follow-up assessment;Term Age:32 hours  LC in to visit with P2 Mom of term baby.  Baby at 2% weight loss.  Mom states baby has latched and fed a couple time, but has mainly been formula fed by bottle.   Asked Mom if she was interested in latching baby.  Ped just examined baby and he was fussing and sucking on a pacifier.  Mom agreeable to [facility] assistance.  Assisted with latch on right breast in football hold.  Baby latched easily.  Baby a little sleepy.  Recommended letting baby stay latched and feed on and off.    Encouraged keeping baby STS and offering the breast with cues.     LATCH Score Latch: Grasps breast easily, tongue down, lips flanged, rhythmical sucking.  Audible Swallowing: A few with stimulation  Type of Nipple: Everted at rest and after stimulation  Comfort (Breast/Nipple): Soft / non-tender  Hold (Positioning): Assistance needed to correctly position infant at breast and maintain latch.  LATCH Score: 8   Interventions Interventions: Breast feeding basics reviewed;Assisted with latch;Skin to skin;Breast massage;Hand express;Breast compression;Adjust position;Support pillows;Position options    Consult Status Consult Status: Follow-up Date: 10/27/21 Follow-up type: In-patient    Judee Clara 10/26/2021, 2:36 PM

## 2021-10-27 MED ORDER — OXYCODONE HCL 5 MG PO TABS: 5.0000 mg | ORAL_TABLET | ORAL | 0 refills | Status: AC | PRN

## 2021-10-27 MED ORDER — IBUPROFEN 600 MG PO TABS: 600.0000 mg | ORAL_TABLET | Freq: Four times a day (QID) | ORAL | 0 refills | Status: AC

## 2021-10-27 NOTE — Discharge Summary (Signed)
RCS OB Discharge Summary     Patient Name: Isabella Silva DOB: 19-Mar-1991 MRN: [id number]  Date of admission: 10/25/2021 Delivering MD: Isabella Silva  Date of delivery: 10/25/2021 Type of delivery: RCS  Newborn Data: Sex: Baby female Circumcision: done in pt 2/25 Live born female  Birth Weight: 7 lb 10.8 oz (3480 g) APGAR: 9, 9  Newborn Delivery   Birth date/time: 10/25/2021 15:46:00 Delivery type: C-Section, Low Transverse Trial of labor: No C-section categorization: Repeat      Feeding: breast and bottle Infant being discharge to home with mother in stable condition.   Admitting diagnosis: Cesarean delivery delivered [O82] Intrauterine pregnancy: [redacted]w[redacted]d     Secondary diagnosis:  Principal Problem:   Cesarean delivery delivered Active Problems:   Gestational hypertension   Normal postpartum course                                Complications: None                                                              Intrapartum Procedures: cesarean: low cervical, transverse Postpartum Procedures: none Complications-Operative and Postpartum: none Augmentation: AROM and at time of delivery    History of Present Illness: [name] is a 31 y.o. female, G2P2002, who presents at [redacted]w[redacted]d weeks gestation. The patient has been followed at  [facility]  Her pregnancy has been complicated by:  Patient Active Problem List   Diagnosis Date Noted   Normal postpartum course 10/27/2021   Gestational hypertension 10/25/2021   Cesarean delivery delivered 10/25/2021   Papanicolaou smear of cervix with positive high risk human papilloma virus (HPV) test 01/04/2021   Hx of herpes genitalis 12/30/2019   Family history of breast cancer in first degree relative 12/30/2019   Anxiety 08/30/2014   Herpes simplex 08/30/2014   Postpartum depression 08/30/2014   Sickle cell trait (HCC) 08/30/2014   Status post primary low transverse cesarean section--breech  05/26/2014     Active Ambulatory Problems    Diagnosis Date Noted   Status post primary low transverse cesarean section--breech 05/26/2014   Hx of herpes genitalis 12/30/2019   Family history of breast cancer in first degree relative 12/30/2019   Anxiety 08/30/2014   Herpes simplex 08/30/2014   Postpartum depression 08/30/2014   Sickle cell trait (HCC) 08/30/2014   Papanicolaou smear of cervix with positive high risk human papilloma virus (HPV) test 01/04/2021   Resolved Ambulatory Problems    Diagnosis Date Noted   Breech presentation 05/26/2014   Past Medical History:  Diagnosis Date   HSV infection      Hospital course:  Sceduled C/S   31 y.o. yo G2P2002 at [redacted]w[redacted]d was admitted to the hospital 10/25/2021 for scheduled cesarean section with the following indication:Elective Repeat.Delivery details are as follows:  Membrane Rupture Time/Date: 3:45 PM ,10/25/2021   Delivery Method:C-Section, Low Transverse  Details of operation can be found in separate operative note.  Patient had an uncomplicated postpartum course.  She is ambulating, tolerating a regular diet, passing flatus, and urinating well. Patient is discharged home in stable condition on  10/27/21        Newborn Data: Birth date:10/25/2021  Birth  time:3:46 PM  Gender:Female  Living status:Living  Apgars:9 ,9  Weight:3480 g    Postpartum Postoperative Day # 2  Isabella Silva, G2P2002, [redacted]w[redacted]d, S/P RCS LT Cesarean Section due to admitted on 2/23 at 39.1 weeks for breech, 1 vessel cord, sickle cell trait, h/o HSV lesion on valtrex, no lesion currently, anxiety and depression no meds mood stable, New onset GHTN PCR 0.25, CMP wnl, CBC WNL, asymptomatic, BP currently 111/73. Pt was taken for RCS with Dr Isabella Silva on 2/24, EBL was , hgb drop of 12.8-12.2, had a viable baby female whom desires in pt circ. Patient up ad lib, denies syncope or dizziness. Reports consuming regular diet without issues and denies N/V. Patient reports 0  bowel movement + passing flatus.  Denies issues with urination and reports bleeding is "lighter."  Patient is breast and bottle feeding and reports going well.  Desires undecided for postpartum contraception.  Pain is being appropriately managed with use of po meds. Denies HA, RUQ pain or vision changes. Mood stable. Declined depression or anxiety meds at this time. Pt meet criteria for early discharge and desires to go home.   Physical exam  Vitals:   10/26/21 0603 10/26/21 1445 10/26/21 2100 10/27/21 0440  BP: 113/66 110/63 113/69 111/73  Pulse: 81 82 96 72  Resp: 16 15 16 18   Temp: 97.8 F (36.6 C) 98.3 F (36.8 C) 97.7 F (36.5 C) 97.6 F (36.4 C)  TempSrc: Oral Oral Oral Oral  SpO2:   99%   Weight:      Height:       General: alert Lochia: appropriate Uterine Fundus: firm Incision: Healing well with no significant drainage, No significant erythema, Dressing is clean, dry, and intact, honeycomb dressing CDI Perineum: intact DVT Evaluation: No evidence of DVT seen on physical exam. Negative Homan's sign. No cords or calf tenderness. No significant calf/ankle edema.  Labs: Lab Results  Component Value Date   WBC 12.3 (H) 10/26/2021   HGB 12.2 10/26/2021   HCT 37.5 10/26/2021   MCV 79.4 (L) 10/26/2021   PLT 235 10/26/2021   CMP Latest Ref Rng & Units 10/26/2021  Glucose 70 - 99 mg/dL 10/28/2021)  BUN 6 - 20 mg/dL 9  Creatinine 161(W - 9.60 mg/dL 4.54  Sodium 0.98 - 119 mmol/L 135  Potassium 3.5 - 5.1 mmol/L 4.1  Chloride 98 - 111 mmol/L 104  CO2 22 - 32 mmol/L 22  Calcium 8.9 - 10.3 mg/dL 147)  Total Protein 6.5 - 8.1 g/dL 5.9(L)  Total Bilirubin 0.3 - 1.2 mg/dL 8.2(N)  Alkaline Phos 38 - 126 U/L 105  AST 15 - 41 U/L 26  ALT 0 - 44 U/L 18    Date of discharge: 10/27/2021 Discharge Diagnoses: Term Pregnancy-delivered Discharge instruction: per After Visit Summary and "Baby and Me Booklet".  After visit meds:   Activity:           unrestricted and pelvic rest  Advance as tolerated. Pelvic rest for 6 weeks.  Diet:                routine Medications: PNV, Ibuprofen, and oxy ir Postpartum contraception: Undecided Condition:  Pt discharge to home with baby in stable GHTN: BP check in 1 week H/O mood disorder: mood check in 1 week.   Meds: Allergies as of 10/27/2021   No Known Allergies      Medication List     STOP taking these medications    valACYclovir 1000 MG tablet Commonly known  as: VALTREX       TAKE these medications    ibuprofen 600 MG tablet Commonly known as: ADVIL Take 1 tablet (600 mg total) by mouth every 6 (six) hours.   NataChew 28-1 MG Chew CHEW AND SWALLOW ONE TABLET BY MOUTH DAILY   oxyCODONE 5 MG immediate release tablet Commonly known as: Oxy IR/ROXICODONE Take 1 tablet (5 mg total) by mouth every 4 (four) hours as needed for moderate pain.               Discharge Care Instructions  (From admission, onward)           Start     Ordered   10/27/21 0000  Discharge wound care:       Comments: Take dressing off on day 5-7 postpartum.  Report increased drainage, redness or warmth. Clean with water, let soap trickle down body. Can leave steri strips on until they fall off or take them off gently at day 10. Keep open to air, clean and dry.   10/27/21 1111            Discharge Follow Up:   Follow-up Information     Isabella Browns, MD. Schedule an appointment as soon as possible for a visit in 6 week(s).   Specialty: Obstetrics and Gynecology Why: 6  week postpartum check. Contact information: [address] [place] [address] [phone]         [facility]. Schedule an appointment as soon as possible for a visit in 1 week(s).   Specialty: Obstetrics and Gynecology Why: mood and BP check for 1 week. Contact information: [address] [place] [address] [place] [address] [phone]                 [place],  NP-C, CNM 10/27/2021, 11:12 AM  Dale , FNP

## 2021-11-05 ENCOUNTER — Telehealth (HOSPITAL_COMMUNITY): Payer: Self-pay | Admitting: *Deleted

## 2021-11-05 NOTE — Telephone Encounter (Signed)
Attempted hospital discharge follow-up call. No answer received. Deforest Hoyles, RN, 11/05/21, [phone] ?

## 2021-11-05 NOTE — Telephone Encounter (Signed)
Patient voiced no questions or concerns at this time. EPDS=0. Patient voiced no questions or concerns regarding infant at this time. Patient reports infant sleeps in a crib on his back. RN reviewed ABCs of safe sleep. Patient verbalized understanding. Patient requested RN email information on hospital's virtual postpartum classes and support groups. Email sent. Deforest Hoyles, RN, 11/05/21, [phone]   ?

## 2023-05-06 ENCOUNTER — Other Ambulatory Visit: Payer: Self-pay | Admitting: Obstetrics & Gynecology

## 2023-05-06 DIAGNOSIS — N644 Mastodynia: Secondary | ICD-10-CM

## 2023-05-20 ENCOUNTER — Ambulatory Visit
Admission: RE | Admit: 2023-05-20 | Discharge: 2023-05-20 | Disposition: A | Discharge status: Home / Self Care | Payer: Medicaid Other | Source: Ambulatory Visit | Attending: Obstetrics & Gynecology

## 2023-05-20 ENCOUNTER — Ambulatory Visit: Admission: RE | Admit: 2023-05-20 | Payer: Medicaid Other | Source: Ambulatory Visit

## 2023-05-20 DIAGNOSIS — N644 Mastodynia: Secondary | ICD-10-CM

## 2023-06-09 ENCOUNTER — Other Ambulatory Visit: Payer: Medicaid Other

## 2023-07-01 ENCOUNTER — Telehealth: Payer: Self-pay | Admitting: Nurse Practitioner

## 2023-07-01 NOTE — Telephone Encounter (Signed)
Lvmtcb to schedule an appt

## 2023-07-15 ENCOUNTER — Ambulatory Visit
Admission: EM | Admit: 2023-07-15 | Discharge: 2023-07-15 | Disposition: A | Discharge status: Home / Self Care | Payer: Medicaid Other | Attending: Internal Medicine | Admitting: Internal Medicine

## 2023-07-15 ENCOUNTER — Encounter: Payer: Self-pay | Admitting: Emergency Medicine

## 2023-07-15 DIAGNOSIS — L03011 Cellulitis of right finger: Secondary | ICD-10-CM | POA: Diagnosis not present

## 2023-07-15 MED ORDER — CEPHALEXIN 500 MG PO CAPS: 500.0000 mg | ORAL_CAPSULE | Freq: Three times a day (TID) | ORAL | 0 refills | Status: AC

## 2023-07-15 NOTE — ED Provider Notes (Signed)
[name]  Note:  This document was prepared using Conservation officer, historic buildings and may include unintentional dictation errors.  MRN: [id number] DOB: 05/19/1991  Subjective:   Isabella Silva is a 32 y.o. female presenting for several day history of tenderness across the fingertip of the third right finger.  Patient had artificial nails that ended up causing a reaction.  She has since had them removed.  Most of her fingers are doing better except for the right middle finger.  Has significant pain, drainage.  No current facility-administered medications for this encounter.  Current Outpatient Medications:    medroxyPROGESTERone (DEPO-PROVERA) 150 MG/ML injection, Inject 150 mg into the muscle every 3 (three) months., Disp: , Rfl:    ibuprofen (ADVIL) 600 MG tablet, Take 1 tablet (600 mg total) by mouth every 6 (six) hours., Disp: 30 tablet, Rfl: 0   oxyCODONE (OXY IR/ROXICODONE) 5 MG immediate release tablet, Take 1 tablet (5 mg total) by mouth every 4 (four) hours as needed for moderate pain., Disp: 20 tablet, Rfl: 0   Prenatal Vit-Fe Fum-Fe Bisg-FA (NATACHEW) 28-1 MG CHEW, CHEW AND SWALLOW ONE TABLET BY MOUTH DAILY, Disp: 30 tablet, Rfl: 5   No Known Allergies  Past Medical History:  Diagnosis Date   HSV infection      Past Surgical History:  Procedure Laterality Date   CESAREAN SECTION N/A 05/25/2014   Procedure: PRIMARY CESAREAN SECTION;  Surgeon: Konrad Felix, MD;  Location: [facility];  Service: Obstetrics;  Laterality: N/A;   CESAREAN SECTION N/A 10/25/2021   Procedure: CESAREAN SECTION;  Surgeon: Hoover Browns, MD;  Location: [facility];  Service: Obstetrics;  Laterality: N/A;   WISDOM TOOTH EXTRACTION      Family History  Problem Relation Age of Onset   Diabetes Mother    Cancer Paternal Grandmother        breast 50   Cancer Paternal Aunt 78       breast    Social History   Tobacco Use   Smoking status: Never   Smokeless tobacco:  Never  Vaping Use   Vaping status: Never Used  Substance Use Topics   Alcohol use: No   Drug use: No    ROS   Objective:   Vitals: BP (!) 131/90 (BP Location: Left Arm)   Pulse 75   Temp 98.5 F (36.9 C) (Oral)   Resp 17   LMP 07/10/2023   SpO2 98%   Physical Exam Constitutional:      General: She is not in acute distress.    Appearance: Normal appearance. She is well-developed. She is not ill-appearing, toxic-appearing or diaphoretic.  HENT:     Head: Normocephalic and atraumatic.     Nose: Nose normal.     Mouth/Throat:     Mouth: Mucous membranes are moist.  Eyes:     General: No scleral icterus.       Right eye: No discharge.        Left eye: No discharge.     Extraocular Movements: Extraocular movements intact.  Cardiovascular:     Rate and Rhythm: Normal rate.  Pulmonary:     Effort: Pulmonary effort is normal.  Musculoskeletal:       Hands:  Skin:    General: Skin is warm and dry.  Neurological:     General: No focal deficit present.     Mental Status: She is alert and oriented to person, place, and time.  Psychiatric:  Mood and Affect: Mood normal.        Behavior: Behavior normal.     Assessment and Plan :   PDMP not reviewed this encounter.  1. Infection of fingernail of right hand    Recommended covering for secondary infection of the fingernail of the right hand.  Start cephalexin.  Use warm soaks.  Counseled patient on potential for adverse effects with medications prescribed/recommended today, ER and return-to-clinic precautions discussed, patient verbalized understanding.    Isabella Silva, [place] 07/16/23 [phone]

## 2023-07-15 NOTE — ED Triage Notes (Signed)
Pt reports that she wears fake nails and when got them redone few weeks ago has cracks in her nails then swelling, pain and pus drainage. Had fake nails since removed. Reports pain and drainage is worse on right 3rd finger, right 5th finger is starting to be better.

## 2023-07-15 NOTE — Discharge Instructions (Signed)
Start cephalexin to address secondary finger/fingernail infection. Take this 3 times daily with food for 1 week. You can use warm soaks 3-5 times daily 5-10 minutes at a time as your schedule allows. Dove for gentle skin or Dial antibacterial soap can be used.

## 2023-09-16 ENCOUNTER — Ambulatory Visit: Payer: Medicaid Other | Admitting: Nurse Practitioner

## 2023-09-30 ENCOUNTER — Other Ambulatory Visit: Payer: Self-pay | Admitting: Obstetrics & Gynecology

## 2023-09-30 DIAGNOSIS — K429 Umbilical hernia without obstruction or gangrene: Secondary | ICD-10-CM

## 2024-02-09 ENCOUNTER — Encounter: Payer: Self-pay | Admitting: Nurse Practitioner

## 2024-03-28 ENCOUNTER — Other Ambulatory Visit

## 2024-03-31 ENCOUNTER — Inpatient Hospital Stay
Admission: RE | Admit: 2024-03-31 | Discharge: 2024-03-31 | Discharge status: Home / Self Care | Source: Ambulatory Visit | Attending: Obstetrics & Gynecology

## 2024-03-31 DIAGNOSIS — K429 Umbilical hernia without obstruction or gangrene: Secondary | ICD-10-CM

## 2024-04-14 ENCOUNTER — Telehealth: Payer: Self-pay

## 2024-04-14 NOTE — Telephone Encounter (Signed)
 Patient returned call and was explained about the appointments. One will be cancelled.

## 2024-04-14 NOTE — Telephone Encounter (Signed)
 LVM for pt to cb. Have a few questions regarding a couple of future appointments scheduled for this pt.

## 2024-04-15 ENCOUNTER — Ambulatory Visit: Admitting: Family Medicine

## 2024-04-19 ENCOUNTER — Encounter: Admitting: Family Medicine

## 2024-04-25 ENCOUNTER — Ambulatory Visit: Admitting: Family Medicine

## 2024-05-06 ENCOUNTER — Ambulatory Visit: Admitting: Family Medicine

## 2024-05-23 ENCOUNTER — Encounter: Admitting: Family Medicine

## 2024-10-20 ENCOUNTER — Ambulatory Visit: Admitting: Nurse Practitioner
